# Patient Record
Sex: Female | Born: 1985 | Hispanic: Yes | Marital: Married | Smoking: Never smoker
Health system: Southern US, Community
[De-identification: ages and names within clinical notes are randomized; demographics above are authoritative.]

## PROBLEM LIST (undated history)

## (undated) DIAGNOSIS — Z789 Other specified health status: Secondary | ICD-10-CM

## (undated) DIAGNOSIS — O24419 Gestational diabetes mellitus in pregnancy, unspecified control: Secondary | ICD-10-CM

## (undated) HISTORY — DX: Gestational diabetes mellitus in pregnancy, unspecified control: O24.419

## (undated) HISTORY — PX: NO PAST SURGERIES: SHX2092

## (undated) HISTORY — DX: Other specified health status: Z78.9

---

## 2017-06-25 LAB — OB RESULTS CONSOLE ABO/RH: RH Type: POSITIVE

## 2017-06-25 LAB — SICKLE CELL SCREEN: Sickle Cell Screen: NORMAL

## 2017-06-25 LAB — OB RESULTS CONSOLE RPR: RPR: NONREACTIVE

## 2017-06-25 LAB — OB RESULTS CONSOLE RUBELLA ANTIBODY, IGM: RUBELLA: IMMUNE

## 2017-06-25 LAB — OB RESULTS CONSOLE VARICELLA ZOSTER ANTIBODY, IGG: VARICELLA IGG: IMMUNE

## 2017-06-25 LAB — OB RESULTS CONSOLE HIV ANTIBODY (ROUTINE TESTING): HIV: NONREACTIVE

## 2017-06-25 LAB — OB RESULTS CONSOLE GC/CHLAMYDIA
Chlamydia: NEGATIVE
Gonorrhea: NEGATIVE

## 2017-06-25 LAB — OB RESULTS CONSOLE HEPATITIS B SURFACE ANTIGEN: Hepatitis B Surface Ag: NEGATIVE

## 2017-06-25 LAB — OB RESULTS CONSOLE HGB/HCT, BLOOD: HEMOGLOBIN: 12.6

## 2017-06-28 LAB — OB RESULTS CONSOLE HGB/HCT, BLOOD: HCT: 37

## 2017-06-28 LAB — OB RESULTS CONSOLE PLATELET COUNT: Platelets: 289

## 2017-07-19 ENCOUNTER — Encounter: Payer: Self-pay | Admitting: *Deleted

## 2017-07-19 ENCOUNTER — Ambulatory Visit: Payer: Self-pay | Admitting: *Deleted

## 2017-07-19 ENCOUNTER — Encounter: Payer: Self-pay | Attending: Obstetrics and Gynecology | Admitting: *Deleted

## 2017-07-19 DIAGNOSIS — R7302 Impaired glucose tolerance (oral): Secondary | ICD-10-CM | POA: Insufficient documentation

## 2017-07-19 DIAGNOSIS — R7309 Other abnormal glucose: Secondary | ICD-10-CM

## 2017-07-19 DIAGNOSIS — Z713 Dietary counseling and surveillance: Secondary | ICD-10-CM | POA: Insufficient documentation

## 2017-07-19 NOTE — Progress Notes (Signed)
  Patient was seen on 07/19/2017 for Gestational Diabetes self-management . She is here with Spanish interpretor. She states no history of GDM herself or any known family history of DM. EDD Dec 23, 2017. Diet history indicates good variety of all food groups and 3 meals with 2-3 snacks per day. 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 No other exercise reported. She states she [facility] some [facility]. The following learning objectives were met by the patient :   States the definition of Gestational Diabetes  States why dietary management is important in controlling blood glucose  Describes the effects of carbohydrates on blood glucose levels  Demonstrates ability to create a balanced meal plan  Demonstrates carbohydrate counting   States when to check blood glucose levels  Demonstrates proper blood glucose monitoring techniques  States the effect of stress and exercise on blood glucose levels  States the importance of limiting caffeine and abstaining from alcohol and smoking  Plan:  Aim for 3 Carb Choices per meal (45 grams) +/- 1 either way  Aim for 1-2 Carbs per snack Begin reading food labels for Total Carbohydrate of foods Consider  increasing your activity level by walking or other activity daily as tolerated Begin checking BG before breakfast and 2 hours after first bite of breakfast, lunch and dinner as directed by MD  Bring Log Book to every medical appointment   Take medication if directed by MD  Blood glucose monitor given: True Track Lot # [id number] Exp: 11/11/2017 Blood glucose reading: 82 mg/dl fasting  Patient instructed to monitor glucose levels: FBS: 60 - 95 mg/dl 2 hour: <120 mg/dl  Patient received the following handouts: in Spanish  Nutrition Diabetes and Pregnancy  Carbohydrate Counting List  Patient will be seen for follow-up as  needed.

## 2017-07-31 NOTE — L&D Delivery Note (Signed)
Kathleen Mcgee is a 32 y.o. female G3P2002 with IUP at [redacted]w[redacted]d admitted for spontaneous onset of labor.  She progressed without augmentation to complete and pushed 10 minutes to deliver. The delivery was complicated by a shoulder dystocia.    Delivery Note At 4:17 PM a viable female was delivered via Vaginal, Spontaneous (Presentation: LOA).  APGAR: 1, 9; weight 9 lb 1.3 oz (4119 g).   Placenta status: spontaneous, intact.  Cord: 3 vessels  At delivery of the head, no movement with subsequent push noted. Shoulder dystocia identified.  First Maneuver: McRoberts Second Maneuver: Suprapubic pressure  Dr. Jolayne Panther called to bedside after second maneuver. Code APGAR called.  Third Maneuver: left side lying, attempted woodscrew and rubin maneuvers with no movement. Fourth Maneuver: Antonieta Pert, attempted delivery of posterior arm Fifth Maneuver: right side lying and delivery of posterior arm  Infant delivered rapidly after delivery of posterior arm. Cord clamped and cut by CNM and passed to waiting NICU team. See NICU note.  Total time: 2 minutes 30 seconds  Anesthesia:  none Episiotomy: None Lacerations:  none Suture Repair: n/a Est. Blood Loss (mL):  100  Mom to postpartum.  Baby to Couplet care / Skin to Skin.  Rolm Bookbinder CNM 12/28/2017, 4:36 PM

## 2017-08-01 ENCOUNTER — Encounter: Payer: Self-pay | Admitting: Obstetrics and Gynecology

## 2017-08-06 ENCOUNTER — Encounter: Payer: Self-pay | Admitting: Family Medicine

## 2017-08-30 ENCOUNTER — Other Ambulatory Visit: Payer: Self-pay

## 2017-08-30 ENCOUNTER — Encounter: Payer: Self-pay | Admitting: *Deleted

## 2017-08-30 ENCOUNTER — Encounter: Payer: Self-pay | Admitting: Family Medicine

## 2017-08-30 ENCOUNTER — Ambulatory Visit (INDEPENDENT_AMBULATORY_CARE_PROVIDER_SITE_OTHER): Payer: Self-pay | Admitting: Family Medicine

## 2017-08-30 DIAGNOSIS — O24410 Gestational diabetes mellitus in pregnancy, diet controlled: Secondary | ICD-10-CM | POA: Insufficient documentation

## 2017-08-30 DIAGNOSIS — O0992 Supervision of high risk pregnancy, unspecified, second trimester: Secondary | ICD-10-CM

## 2017-08-30 DIAGNOSIS — O0990 Supervision of high risk pregnancy, unspecified, unspecified trimester: Secondary | ICD-10-CM

## 2017-08-30 DIAGNOSIS — O0993 Supervision of high risk pregnancy, unspecified, third trimester: Secondary | ICD-10-CM | POA: Insufficient documentation

## 2017-08-30 DIAGNOSIS — O24419 Gestational diabetes mellitus in pregnancy, unspecified control: Secondary | ICD-10-CM | POA: Insufficient documentation

## 2017-08-30 LAB — GLUCOSE, 3 HOUR GESTATIONAL

## 2017-08-30 LAB — GLUCOSE, 1 HOUR: Glucose Tolerance, 1 hour: 160

## 2017-08-30 MED ORDER — ASPIRIN 81 MG PO CHEW: 81.0000 mg | CHEWABLE_TABLET | Freq: Every day | ORAL | 3 refills | Status: DC

## 2017-08-30 NOTE — Progress Notes (Signed)
   PRENATAL VISIT NOTE Spanish interpreter: Okey Regalarol used  Subjective:  Kathleen Mcgee is a 32 y.o. G3P2002 at 10335w4d being seen today for transferring prenatal care.  She is currently monitored for the following issues for this high-risk pregnancy and has Supervision of high risk pregnancy, antepartum and Gestational diabetes on their problem list.  Patient reports no complaints.   . Vag. Bleeding: None.  Movement: Present. Denies leaking of fluid.   The following portions of the patient's history were reviewed and updated as appropriate: allergies, current medications, past family history, past medical history, past social history, past surgical history and problem list. Problem list updated.  Objective:   Vitals:   08/30/17 0834  BP: 110/60  Pulse: 81  Weight: 156 lb 4.8 oz (70.9 kg)    Fetal Status: Fetal Heart Rate (bpm): 157 Fundal Height: 23 cm Movement: Present     General:  Alert, oriented and cooperative. Patient is in no acute distress.  Skin: Skin is warm and dry. No rash noted.   Cardiovascular: Normal heart rate noted  Respiratory: Normal respiratory effort, no problems with respiration noted  Abdomen: Soft, gravid, appropriate for gestational age.  Pain/Pressure: Present     Pelvic: Cervical exam deferred        Extremities: Normal range of motion.  Edema: None  Mental Status:  Normal mood and affect. Normal behavior. Normal judgment and thought content.  No book today reports FBS in high 80s and pp in 90-100 range Assessment and Plan:  Pregnancy: G3P2002 at 4735w4d  1. Supervision of high risk pregnancy, antepartum Needs anatomy--+ dating - US MFM OB DETAIL +14 WK; Future  2. Diet controlled gestational diabetes mellitus (GDM) in second trimester Basleine labs and ASA Consider ECHO if A1C is > 6.5  - Comprehensive metabolic panel - Hemoglobin A1c - Protein / creatinine ratio, urine - TSH - aspirin 81 MG chewable tablet; Chew 1 tablet (81 mg total)  by mouth daily.  Dispense: 90 tablet; Refill: 3 - US MFM OB DETAIL +14 WK; Future  Preterm labor symptoms and general obstetric precautions including but not limited to vaginal bleeding, contractions, leaking of fluid and fetal movement were reviewed in detail with the patient. Please refer to After Visit Summary for other counseling recommendations.  Return in about 2 weeks (around 09/13/2017).   Reva Boresanya S Dalyah Pla, MD

## 2017-08-30 NOTE — Patient Instructions (Signed)
 Lactancia materna Breastfeeding Decidir amamantar es una de las mejores elecciones que puede hacer por usted y su beb. Un cambio en las hormonas durante el embarazo hace que las mamas produzcan leche materna en las glndulas productoras de leche. Las hormonas impiden que la leche materna sea liberada antes del nacimiento del beb. Adems, impulsan el flujo de leche luego del nacimiento. Una vez que ha comenzado a amamantar, pensar en el beb, as como la succin o el llanto, pueden estimular la liberacin de leche de las glndulas productoras de leche. Los beneficios de amamantar Las investigaciones demuestran que la lactancia materna ofrece muchos beneficios de salud para bebs y madres. Adems, ofrece una forma gratuita y conveniente de alimentar al beb. Para el beb  La primera leche (calostro) ayuda a mejorar el funcionamiento del aparato digestivo del beb.  Las clulas especiales de la leche (anticuerpos) ayudan a combatir las infecciones en el beb.  Los bebs que se alimentan con leche materna tambin tienen menos probabilidades de tener asma, alergias, obesidad o diabetes de tipo 2. Adems, tienen menor riesgo de sufrir el sndrome de muerte sbita del lactante (SMSL).  Los nutrientes de la leche materna son mejores para satisfacer las necesidades del beb en comparacin con la leche maternizada.  La leche materna mejora el desarrollo cerebral del beb. Para usted  La lactancia materna favorece el desarrollo de un vnculo muy especial entre la madre y el beb.  Es conveniente. La leche materna es econmica y siempre est disponible a la temperatura correcta.  La lactancia materna ayuda a quemar caloras. Le ayuda a perder el peso ganado durante el embarazo.  Hace que el tero vuelva al tamao que tena antes del embarazo ms rpido. Adems, disminuye el sangrado (loquios) despus del parto.  La lactancia materna contribuye a reducir el riesgo de tener diabetes de tipo 2,  osteoporosis, artritis reumatoide, enfermedades cardiovasculares y cncer de mama, ovario, tero y endometrio en el futuro. Informacin bsica sobre la lactancia Comienzo de la lactancia  Encuentre un lugar cmodo para sentarse o acostarse, con un buen respaldo para el cuello y la espalda.  Coloque una almohada o una manta enrollada debajo del beb para acomodarlo a la altura de la mama (si est sentada). Las almohadas para amamantar se han diseado especialmente a fin de servir de apoyo para los brazos y el beb mientras amamanta.  Asegrese de que la barriga del beb (abdomen) est frente a la suya.  Masajee suavemente la mama. Con las yemas de los dedos, masajee los bordes exteriores de la mama hacia adentro, en direccin al pezn. Esto estimula el flujo de leche. Si la leche fluye lentamente, es posible que deba continuar con este movimiento durante la lactancia.  Sostenga la mama con 4 dedos por debajo y el pulgar por arriba del pezn (forme la letra "C" con la mano). Asegrese de que los dedos se encuentren lejos del pezn y de la boca del beb.  Empuje suavemente los labios del beb con el pezn o con el dedo.  Cuando la boca del beb se abra lo suficiente, acrquelo rpidamente a la mama e introduzca todo el pezn y la arola, tanto como sea posible, dentro de la boca del beb. La arola es la zona de color que rodea al pezn. ? Debe haber ms arola visible por arriba del labio superior del beb que por debajo del labio inferior. ? Los labios del beb deben estar abiertos y extendidos hacia afuera (evertidos) para asegurar que   el beb se prenda de forma adecuada y cmoda. ? La lengua del beb debe estar entre la enca inferior y la mama.  Asegrese de que la boca del beb est en la posicin correcta alrededor del pezn (prendido). Los labios del beb deben crear un sello sobre la mama y estar doblados hacia afuera (invertidos).  Es comn que el beb succione durante 2 a 3 minutos  para que comience el flujo de leche materna. Cmo debe prenderse Es muy importante que le ensee al beb cmo prenderse adecuadamente a la mama. Si el beb no se prende adecuadamente, puede causar dolor en los pezones, reducir la produccin de leche materna y hacer que el beb tenga un escaso aumento de peso. Adems, si el beb no se prende adecuadamente al pezn, puede tragar aire durante la alimentacin. Esto puede causarle molestias al beb. Hacer eructar al beb al cambiar de mama puede ayudarlo a liberar el aire. Sin embargo, ensearle al beb cmo prenderse a la mama adecuadamente es la mejor manera de evitar que se sienta molesto por tragar aire mientras se alimenta. Signos de que el beb se ha prendido adecuadamente al pezn  Tironea o succiona de modo silencioso, sin causarle dolor. Los labios del beb deben estar extendidos hacia afuera (evertidos).  Se escucha que traga cada 3 o 4 succiones una vez que la leche ha comenzado a fluir (despus de que se produzca el reflejo de eyeccin de la leche).  Hay movimientos musculares por arriba y por delante de sus odos al succionar.  Signos de que el beb no se ha prendido adecuadamente al pezn  Hace ruidos de succin o de chasquido mientras se alimenta.  Siente dolor en los pezones.  Si cree que el beb no se prendi correctamente, deslice el dedo en la comisura de la boca y colquelo entre las encas del beb para interrumpir la succin. Intente volver a comenzar a amamantar. Signos de lactancia materna exitosa Signos del beb  El beb disminuir gradualmente el nmero de succiones o dejar de succionar por completo.  El beb se quedar dormido.  El cuerpo del beb se relajar.  El beb retendr una pequea cantidad de leche en la boca.  El beb se desprender solo del pecho.  Signos que presenta usted  Las mamas han aumentado la firmeza, el peso y el tamao 1 a 3 horas despus de amamantar.  Estn ms blandas inmediatamente  despus de amamantar.  Se producen un aumento del volumen de leche y un cambio en su consistencia y color hacia el quinto da de lactancia.  Los pezones no duelen, no estn agrietados ni sangran.  Signos de que su beb recibe la cantidad de leche suficiente  Mojar por lo menos 1 o 2paales durante las primeras 24horas despus del nacimiento.  Mojar por lo menos 5 o 6paales cada 24horas durante la primera semana despus del nacimiento. La orina debe ser clara o de color amarillo plido a los 5das de vida.  Mojar entre 6 y 8paales cada 24horas a medida que el beb sigue creciendo y desarrollndose.  Defeca por lo menos 3 veces en 24 horas a los 5 das de vida. Las heces deben ser blandas y amarillentas.  Defeca por lo menos 3 veces en 24 horas a los 7 das de vida. Las heces deben ser grumosas y amarillentas.  No registra una prdida de peso mayor al 10% del peso al nacer durante los primeros 3 das de vida.  Aumenta de peso un   promedio de 4 a 7onzas (113 a 198g) por semana despus de los 4 das de vida.  Aumenta de peso, diariamente, de manera uniforme a partir de los 5 das de vida, sin registrar prdida de peso despus de las 2semanas de vida. Despus de alimentarse, es posible que el beb regurgite una pequea cantidad de leche. Esto es normal. Frecuencia y duracin de la lactancia El amamantamiento frecuente la ayudar a producir ms leche y puede prevenir dolores en los pezones y las mamas extremadamente llenas (congestin mamaria). Alimente al beb cuando muestre signos de hambre o si siente la necesidad de reducir la congestin de las mamas. Esto se denomina "lactancia a demanda". Las seales de que el beb tiene hambre incluyen las siguientes:  Aumento del estado de alerta, actividad o inquietud.  Mueve la cabeza de un lado a otro.  Abre la boca cuando se le toca la mejilla o la comisura de la boca (reflejo de bsqueda).  Aumenta las vocalizaciones, tales como  sonidos de succin, se relame los labios, emite arrullos, suspiros o chirridos.  Mueve la mano hacia la boca y se chupa los dedos o las manos.  Est molesto o llora.  Evite el uso del chupete en las primeras 4 a 6 semanas despus del nacimiento del beb. Despus de este perodo, podr usar un chupete. Las investigaciones demostraron que el uso del chupete durante el primer ao de vida del beb disminuye el riesgo de tener el sndrome de muerte sbita del lactante (SMSL). Permita que el nio se alimente en cada mama todo lo que desee. Cuando el beb se desprende o se queda dormido mientras se est alimentando de la primera mama, ofrzcale la segunda. Debido a que, con frecuencia, los recin nacidos estn somnolientos las primeras semanas de vida, es posible que deba despertar al beb para alimentarlo. Los horarios de lactancia varan de un beb a otro. Sin embargo, las siguientes reglas pueden servir como gua para ayudarla a garantizar que el beb se alimenta adecuadamente:  Se puede amamantar a los recin nacidos (bebs de 4 semanas o menos de vida) cada 1 a 3 horas.  No deben transcurrir ms de 3 horas durante el da o 5 horas durante la noche sin que se amamante a los recin nacidos.  Debe amamantar al beb un mnimo de 8 veces en un perodo de 24 horas.  Extraccin de leche materna La extraccin y el almacenamiento de la leche materna le permiten asegurarse de que el beb se alimente exclusivamente de su leche materna, aun en momentos en los que no puede amamantar. Esto tiene especial importancia si debe regresar al trabajo en el perodo en que an est amamantando o si no puede estar presente en los momentos en que el beb debe alimentarse. Su asesor en lactancia puede ayudarla a encontrar un mtodo de extraccin que funcione mejor para usted y orientarla sobre cunto tiempo es seguro almacenar leche materna. Cmo cuidar las mamas durante la lactancia Los pezones pueden secarse, agrietarse y  doler durante la lactancia. Las siguientes recomendaciones pueden ayudarla a mantener las mamas humectadas y sanas:  Evite usar jabn en los pezones.  Use un sostn de soporte diseado especialmente para la lactancia materna. Evite usar sostenes con aro o sostenes muy ajustados (sostenes deportivos).  Seque al aire sus pezones durante 3 a 4minutos despus de amamantar al beb.  Utilice solo apsitos de algodn en el sostn para absorber las prdidas de leche. La prdida de un poco de leche materna   entre las tomas es normal.  Utilice lanolina sobre los pezones luego de amamantar. La lanolina ayuda a mantener la humedad normal de la piel. La lanolina pura no es perjudicial (no es txica) para el beb. Adems, puede extraer manualmente algunas gotas de leche materna y masajear suavemente esa leche sobre los pezones para que la leche se seque al aire.  Durante las primeras semanas despus del nacimiento, algunas mujeres experimentan congestin mamaria. La congestin mamaria puede hacer que sienta las mamas pesadas, calientes y sensibles al tacto. El pico de la congestin mamaria ocurre en el plazo de los 3 a 5 das despus del parto. Las siguientes recomendaciones pueden ayudarla a aliviar la congestin mamaria:  Vace por completo las mamas al amamantar o extraer leche. Puede aplicar calor hmedo en las mamas (en la ducha o con toallas hmedas para manos) antes de amamantar o extraer leche. Esto aumenta la circulacin y ayuda a que la leche fluya. Si el beb no vaca por completo las mamas cuando lo amamanta, extraiga la leche restante despus de que haya finalizado.  Aplique compresas de hielo sobre las mamas inmediatamente despus de amamantar o extraer leche, a menos que le resulte demasiado incmodo. Haga lo siguiente: ? Ponga el hielo en una bolsa plstica. ? Coloque una toalla entre la piel y la bolsa de hielo. ? Coloque el hielo durante 20minutos, 2 o 3veces por da.  Asegrese de que el  beb est prendido y se encuentre en la posicin correcta mientras lo alimenta.  Si la congestin mamaria persiste luego de 48 horas o despus de seguir estas recomendaciones, comunquese con su mdico o un asesor en lactancia. Recomendaciones de salud general durante la lactancia  Consuma 3 comidas y 3 colaciones saludables todos los das. Las madres bien alimentadas que amamantan necesitan entre 450 y 500 caloras adicionales por da. Puede cumplir con este requisito al aumentar la cantidad de una dieta equilibrada que realice.  Beba suficiente agua para mantener la orina clara o de color amarillo plido.  Descanse con frecuencia, reljese y siga tomando sus vitaminas prenatales para prevenir la fatiga, el estrs y los niveles bajos de vitaminas y minerales en el cuerpo (deficiencias de nutrientes).  No consuma ningn producto que contenga nicotina o tabaco, como cigarrillos y cigarrillos electrnicos. El beb puede verse afectado por las sustancias qumicas de los cigarrillos que pasan a la leche materna y por la exposicin al humo ambiental del tabaco. Si necesita ayuda para dejar de fumar, consulte al mdico.  Evite el consumo de alcohol.  No consuma drogas ilegales o marihuana.  Antes de usar cualquier medicamento, hable con el mdico. Estos incluyen medicamentos recetados y de venta libre, como tambin vitaminas y suplementos a base de hierbas. Algunos medicamentos, que pueden ser perjudiciales para el beb, pueden pasar a travs de la leche materna.  Puede quedar embarazada durante la lactancia. Si se desea un mtodo anticonceptivo, consulte al mdico sobre cules son las opciones seguras durante la lactancia. Dnde encontrar ms informacin: Liga internacional [place]eche: www.llli.org. Comunquese con un mdico si:  Siente que quiere dejar de amamantar o se siente frustrada con la lactancia.  Sus pezones estn agrietados o sangran.  Sus mamas estn irritadas, sensibles o  calientes.  Tiene los siguientes sntomas: ? Dolor en las mamas o en los pezones. ? Un rea hinchada en cualquiera de las mamas. ? Fiebre o escalofros. ? Nuseas o vmitos. ? Drenaje de otro lquido distinto de la leche materna desde los pezones.    mamas no se llenan antes de amamantar al beb para el quinto da despus del [place].  Se siente triste y deprimida.  El beb: ? Est demasiado somnoliento como para comer bien. ? Tiene problemas para dormir. ? Tiene ms de 1 semana de vida y [employer] de 6 paales en un periodo de 24 horas. ? No ha aumentado de [place] a los [address] de [address]  El beb defeca menos de 3 veces en 24 horas.  La piel del beb o las partes blancas de los ojos se vuelven amarillentas. Solicite ayuda de inmediato si:  El beb est muy cansado Retail buyer) y no se quiere despertar para comer.  Le sube la fiebre sin causa. Resumen  La lactancia materna ofrece muchos beneficios de salud para bebs y [place].  Intente amamantar a su beb cuando muestre signos tempranos de hambre.  Haga cosquillas o empuje suavemente los labios del beb con el dedo o el pezn para lograr que el beb abra la boca. Acerque el beb a la mama. Asegrese de que la mayor parte de la arola se encuentre dentro de la boca del beb. Ofrzcale una mama y haga eructar al beb antes de pasar a la otra.  Hable con su mdico o asesor en lactancia si tiene dudas o problemas con la lactancia. Esta informacin no tiene Theme park manager el consejo del mdico. Asegrese de hacerle al mdico cualquier pregunta que tenga. Document Released: 07/17/2005 Document Revised: 11/06/2016 Document Reviewed: 11/06/2016 Elsevier Interactive Patient Education  2018 [employer]. Diagnstico de diabetes mellitus gestacional (Gestational Diabetes Mellitus, Diagnosis) La diabetes gestacional (diabetes mellitus gestacional) es una forma de diabetes a corto plazo (temporal) que puede Museum/gallery curator.  Este cuadro desaparece despus del parto. Puede deberse a uno de [employer] o a ambos:  El cuerpo no produce la cantidad suficiente de una hormona llamada insulina.  El cuerpo no responde de forma normal a la insulina que produce. La insulina permite que los ciertos azcares (glucosa) ingresen a las clulas del cuerpo. Esto le proporciona la energa. Cuando se tiene diabetes, la glucosa no puede ingresar a las clulas. Esto produce un aumento del nivel de glucosa en la sangre (hiperglucemia). Si la diabetes se trata, es posible que ni usted ni el beb se vean afectados. El mdico fijar los objetivos del tratamiento para usted. Generalmente, los resultados de la glucemia deben ser los siguientes:  Despus de no comer durante mucho tiempo (ayunar): 95mg /dl (3,2GMWN/U).  Despus de las comidas (posprandial): ? Una hora despus de una comida: igual o menor que 140mg /dl (2,7OZDG/U). ? Dos horas despus de una comida: igual o menor que 120mg /dl (4,4IHKV/Q).  Nivel de A1c (hemoglobinaA1c): del 6% al 6,5%. CUIDADOS EN EL HOGAR Preguntas para hacerle al mdico Puede hacer las siguientes preguntas:  Debo reunirme con IT trainer para el cuidado de la diabetes?  Dnde puedo encontrar un grupo de apoyo para personas diabticas?  Qu equipos necesitar para cuidarme en casa?  Qu medicamentos para la diabetes necesito? Cundo debo tomarlos?  Con qu frecuencia debo controlarme el nivel de glucosa en la sangre?  A qu nmero puedo llamar si tengo preguntas?  Cundo es la prxima cita con el mdico? Instrucciones generales  [employer] medicamentos de venta libre y los recetados solamente como se lo haya indicado el mdico.  Mantenga un peso saludable durante el [place].  Concurra a todas las visitas de control como se lo haya indicado el mdico. Esto es importante. SOLICITE AYUDA  SI:  Su nivel de glucosa en la sangre es igual o superior a 240mg /dl  (16,1WRUE/AV13,3mmol/dl).  Su nivel de glucosa en la sangre es igual o superior a 200mg /dl (40,9WJXB/J11,1mmol/l), y tiene cetonas en la orina.  Ha estado enferma o ha tenido fiebre durante 2o ms [address]  [address] y no [place].  Si tiene alguno de estos problemas durante ms de 6horas: ? No puede comer ni beber. ? Siente malestar estomacal (nuseas). ? Vomita. ? La materia fecal es lquida (diarrea). SOLICITE AYUDA DE INMEDIATO SI:  El nivel de glucosa en la sangre est por debajo de 54mg /dl (693mmol/l).  Est confundida.  Tiene dificultad para hacer lo siguiente: ? Pensar con claridad. ? Respirar.  El beb se mueve menos de lo normal.  Tiene los siguientes sntomas: ? Niveles moderados o altos de cetonas en la orina. ? Hemorragia vaginal. ? Secrecin de un lquido fuera de lo comn de la vagina. ? Contracciones prematuras. Estas pueden causar una sensacin de opresin en el vientre. Esta informacin no tiene Theme park managercomo fin reemplazar el consejo del mdico. Asegrese de hacerle al mdico cualquier pregunta que tenga. Document Released: 11/08/2015 Document Revised: 11/08/2015 Document Reviewed: 08/20/2015 Elsevier Interactive Patient Education  [employer]2018 Elsevier Inc.

## 2017-08-31 LAB — COMPREHENSIVE METABOLIC PANEL
ALBUMIN: 3.2 g/dL — AB (ref 3.5–5.5)
ALK PHOS: 88 IU/L (ref 39–117)
ALT: 8 IU/L (ref 0–32)
AST: 8 IU/L (ref 0–40)
Albumin/Globulin Ratio: 1.1 — ABNORMAL LOW (ref 1.2–2.2)
BUN / CREAT RATIO: 12 (ref 9–23)
BUN: 6 mg/dL (ref 6–20)
Bilirubin Total: <0.2 mg/dL (ref 0.0–1.2)
CALCIUM: 9.2 mg/dL (ref 8.7–10.2)
CO2: 20 mmol/L (ref 20–29)
CREATININE: 0.51 mg/dL — AB (ref 0.57–1.00)
Chloride: 104 mmol/L (ref 96–106)
GFR, EST AFRICAN AMERICAN: 146 mL/min/{1.73_m2} (ref >59)
GFR, EST NON AFRICAN AMERICAN: 127 mL/min/{1.73_m2} (ref >59)
GLOBULIN, TOTAL: 3 g/dL (ref 1.5–4.5)
GLUCOSE: 159 mg/dL — AB (ref 65–99)
Potassium: 4 mmol/L (ref 3.5–5.2)
SODIUM: 140 mmol/L (ref 134–144)
TOTAL PROTEIN: 6.2 g/dL (ref 6.0–8.5)

## 2017-08-31 LAB — HEMOGLOBIN A1C
Est. average glucose Bld gHb Est-mCnc: 134 mg/dL
HEMOGLOBIN A1C: 6.3 % — AB (ref 4.8–5.6)

## 2017-08-31 LAB — PROTEIN / CREATININE RATIO, URINE
Creatinine, Urine: 74.7 mg/dL
PROTEIN UR: 9.6 mg/dL
PROTEIN/CREAT RATIO: 129 mg/g{creat} (ref 0–200)

## 2017-08-31 LAB — TSH: TSH: 2.57 u[IU]/mL (ref 0.450–4.500)

## 2017-09-04 ENCOUNTER — Encounter (HOSPITAL_COMMUNITY): Payer: Self-pay

## 2017-09-04 ENCOUNTER — Ambulatory Visit (HOSPITAL_COMMUNITY)
Admission: RE | Admit: 2017-09-04 | Discharge: 2017-09-04 | Disposition: A | Discharge status: Home / Self Care | Payer: Self-pay | Source: Ambulatory Visit | Attending: Family Medicine | Admitting: Family Medicine

## 2017-09-04 ENCOUNTER — Other Ambulatory Visit: Payer: Self-pay | Admitting: Family Medicine

## 2017-09-04 DIAGNOSIS — O0990 Supervision of high risk pregnancy, unspecified, unspecified trimester: Secondary | ICD-10-CM

## 2017-09-04 DIAGNOSIS — Z3A24 24 weeks gestation of pregnancy: Secondary | ICD-10-CM

## 2017-09-04 DIAGNOSIS — Z363 Encounter for antenatal screening for malformations: Secondary | ICD-10-CM | POA: Insufficient documentation

## 2017-09-04 DIAGNOSIS — Z3689 Encounter for other specified antenatal screening: Secondary | ICD-10-CM | POA: Insufficient documentation

## 2017-09-04 DIAGNOSIS — O24410 Gestational diabetes mellitus in pregnancy, diet controlled: Secondary | ICD-10-CM | POA: Insufficient documentation

## 2017-09-04 DIAGNOSIS — O09892 Supervision of other high risk pregnancies, second trimester: Secondary | ICD-10-CM | POA: Insufficient documentation

## 2017-09-13 ENCOUNTER — Encounter: Payer: Self-pay | Admitting: Obstetrics and Gynecology

## 2017-12-03 ENCOUNTER — Telehealth: Payer: Self-pay | Admitting: *Deleted

## 2017-12-03 ENCOUNTER — Ambulatory Visit (INDEPENDENT_AMBULATORY_CARE_PROVIDER_SITE_OTHER): Payer: Self-pay | Admitting: Obstetrics & Gynecology

## 2017-12-03 VITALS — BP 121/84 | HR 88 | Wt 166.7 lb

## 2017-12-03 DIAGNOSIS — Z113 Encounter for screening for infections with a predominantly sexual mode of transmission: Secondary | ICD-10-CM

## 2017-12-03 DIAGNOSIS — O0993 Supervision of high risk pregnancy, unspecified, third trimester: Secondary | ICD-10-CM

## 2017-12-03 DIAGNOSIS — O0990 Supervision of high risk pregnancy, unspecified, unspecified trimester: Secondary | ICD-10-CM

## 2017-12-03 DIAGNOSIS — O24410 Gestational diabetes mellitus in pregnancy, diet controlled: Secondary | ICD-10-CM

## 2017-12-03 LAB — POCT URINALYSIS DIP (DEVICE)
Bilirubin Urine: NEGATIVE
GLUCOSE, UA: NEGATIVE mg/dL
Hgb urine dipstick: NEGATIVE
Ketones, ur: NEGATIVE mg/dL
NITRITE: NEGATIVE
Protein, ur: NEGATIVE mg/dL
SPECIFIC GRAVITY, URINE: 1.015 (ref 1.005–1.030)
UROBILINOGEN UA: 0.2 mg/dL (ref 0.0–1.0)
pH: 6.5 (ref 5.0–8.0)

## 2017-12-03 NOTE — Telephone Encounter (Signed)
Patient is scheduled for a f/u u/s 5/17 . Spanish interpreter Cicero Duck used to call patient. Here was no answer. Voice mail was left stating date, time and location of appt. Pt to call back if any questions.

## 2017-12-03 NOTE — Progress Notes (Signed)
   PRENATAL VISIT NOTE  Subjective:  Kathleen Mcgee is a 32 y.o. G3P2002 at [redacted]w[redacted]d being seen today for ongoing prenatal care.  She is currently monitored for the following issues for this high-risk pregnancy and has Supervision of high risk pregnancy, antepartum and Gestational diabetes on their problem list.  Patient reports no complaints.  Contractions: Irritability. Vag. Bleeding: None.  Movement: Present. Denies leaking of fluid.   The following portions of the patient's history were reviewed and updated as appropriate: allergies, current medications, past family history, past medical history, past social history, past surgical history and problem list. Problem list updated.  Objective:   Vitals:   12/03/17 0945  BP: 121/84  Pulse: 88  Weight: 166 lb 11.2 oz (75.6 kg)    Fetal Status:     Movement: Present     General:  Alert, oriented and cooperative. Patient is in no acute distress.  Skin: Skin is warm and dry. No rash noted.   Cardiovascular: Normal heart rate noted  Respiratory: Normal respiratory effort, no problems with respiration noted  Abdomen: Soft, gravid, appropriate for gestational age.  Pain/Pressure: Present     Pelvic: Cervical exam performed        Extremities: Normal range of motion.  Edema: Trace  Mental Status: Normal mood and affect. Normal behavior. Normal judgment and thought content.   Assessment and Plan:  Pregnancy: G3P2002 at 100w1d  1. Supervision of high risk pregnancy in third trimester  - CBC - HIV antibody - RPR - Strep Gp B NAA - Cervicovaginal ancillary only - RPR - HIV antibody - CBC - [place] MFM OB FOLLOW UP; Future - Culture, beta strep (group b only) - Cervicovaginal ancillary only  2. Supervision of high risk pregnancy, antepartum   3. Diet controlled gestational diabetes mellitus (GDM) in third trimester  - Hemoglobin A1c - Stop taking baby asa  Term labor symptoms and general obstetric precautions including  but not limited to vaginal bleeding, contractions, leaking of fluid and fetal movement were reviewed in detail with the patient. Please refer to After Visit Summary for other counseling recommendations.  Return in about 1 week (around 12/10/2017).  No future appointments.  Allie Bossier, MD

## 2017-12-04 LAB — CERVICOVAGINAL ANCILLARY ONLY
BACTERIAL VAGINITIS: NEGATIVE
Candida vaginitis: POSITIVE — AB
Chlamydia: NEGATIVE
Chlamydia: NEGATIVE
NEISSERIA GONORRHEA: NEGATIVE
Neisseria Gonorrhea: NEGATIVE
TRICH (WINDOWPATH): NEGATIVE

## 2017-12-04 LAB — CBC
HEMATOCRIT: 35 % (ref 34.0–46.6)
HEMOGLOBIN: 12.1 g/dL (ref 11.1–15.9)
MCH: 31.6 pg (ref 26.6–33.0)
MCHC: 34.6 g/dL (ref 31.5–35.7)
MCV: 91 fL (ref 79–97)
Platelets: 298 10*3/uL (ref 150–379)
RBC: 3.83 x10E6/uL (ref 3.77–5.28)
RDW: 14.9 % (ref 12.3–15.4)
WBC: 4.8 10*3/uL (ref 3.4–10.8)

## 2017-12-04 LAB — RPR: RPR: NONREACTIVE

## 2017-12-04 LAB — HIV ANTIBODY (ROUTINE TESTING W REFLEX): HIV Screen 4th Generation wRfx: NONREACTIVE

## 2017-12-04 LAB — HEMOGLOBIN A1C
Est. average glucose Bld gHb Est-mCnc: 146 mg/dL
Hgb A1c MFr Bld: 6.7 % — ABNORMAL HIGH (ref 4.8–5.6)

## 2017-12-05 LAB — STREP GP B NAA: STREP GROUP B AG: NEGATIVE

## 2017-12-11 ENCOUNTER — Ambulatory Visit (INDEPENDENT_AMBULATORY_CARE_PROVIDER_SITE_OTHER): Payer: Self-pay | Admitting: Obstetrics and Gynecology

## 2017-12-11 VITALS — BP 122/82 | HR 84 | Wt 166.8 lb

## 2017-12-11 DIAGNOSIS — O0990 Supervision of high risk pregnancy, unspecified, unspecified trimester: Secondary | ICD-10-CM

## 2017-12-11 DIAGNOSIS — O24410 Gestational diabetes mellitus in pregnancy, diet controlled: Secondary | ICD-10-CM

## 2017-12-11 LAB — POCT URINALYSIS DIP (DEVICE)
Bilirubin Urine: NEGATIVE
Glucose, UA: NEGATIVE mg/dL
KETONES UR: NEGATIVE mg/dL
Nitrite: NEGATIVE
PROTEIN: NEGATIVE mg/dL
SPECIFIC GRAVITY, URINE: 1.01 (ref 1.005–1.030)
UROBILINOGEN UA: 0.2 mg/dL (ref 0.0–1.0)
pH: 6.5 (ref 5.0–8.0)

## 2017-12-11 NOTE — Patient Instructions (Signed)
Check sugars daily

## 2017-12-11 NOTE — Progress Notes (Signed)
Subjective:  Kathleen Mcgee is a 32 y.o. G3P2002 at [redacted]w[redacted]d being seen today for ongoing prenatal care.  She is currently monitored for the following issues for this high-risk pregnancy and has Supervision of high risk pregnancy, antepartum and Gestational diabetes on their problem list.  Patient reports no complaints.  Contractions: Irregular. Vag. Bleeding: None.  Movement: Present. Denies leaking of fluid.   The following portions of the patient's history were reviewed and updated as appropriate: allergies, current medications, past family history, past medical history, past social history, past surgical history and problem list. Problem list updated.  Objective:   Vitals:   12/11/17 1115  BP: 122/82  Pulse: 84  Weight: 166 lb 12.8 oz (75.7 kg)    Fetal Status: Fetal Heart Rate (bpm): 152 Fundal Height: 38 cm Movement: Present  Presentation: Vertex  General:  Alert, oriented and cooperative. Patient is in no acute distress.  Skin: Skin is warm and dry. No rash noted.   Cardiovascular: Normal heart rate noted  Respiratory: Normal respiratory effort, no problems with respiration noted  Abdomen: Soft, gravid, appropriate for gestational age. Pain/Pressure: Present     Pelvic: Vag. Bleeding: None     Cervical exam performed Dilation: 3.5 Effacement (%): 50 Station: -3  Extremities: Normal range of motion.  Edema: Trace  Mental Status: Normal mood and affect. Normal behavior. Normal judgment and thought content.   Urinalysis:      Assessment and Plan:  Pregnancy: G3P2002 at [redacted]w[redacted]d  1. Supervision of high risk pregnancy, antepartum Doing well. Schedule induction at next visit.   2. Diet controlled gestational diabetes mellitus (GDM) in third trimester No CBG logs today. States sugars run in the 80s fasting and PP 90-100s. A1c last week 6.7. Not on medications. Growth [place] scheduled for Friday.   Term labor symptoms and general obstetric precautions including but not  limited to vaginal bleeding, contractions, leaking of fluid and fetal movement were reviewed in detail with the patient. Please refer to After Visit Summary for other counseling recommendations.  Return in about 1 week (around 12/18/2017) for ob visit.   Pincus Large, DO

## 2017-12-14 ENCOUNTER — Encounter (HOSPITAL_COMMUNITY): Payer: Self-pay

## 2017-12-14 ENCOUNTER — Ambulatory Visit (HOSPITAL_COMMUNITY)
Admission: RE | Admit: 2017-12-14 | Discharge: 2017-12-14 | Disposition: A | Discharge status: Home / Self Care | Payer: Self-pay | Source: Ambulatory Visit | Attending: Obstetrics & Gynecology | Admitting: Obstetrics & Gynecology

## 2017-12-14 ENCOUNTER — Other Ambulatory Visit: Payer: Self-pay | Admitting: Obstetrics & Gynecology

## 2017-12-14 DIAGNOSIS — O24410 Gestational diabetes mellitus in pregnancy, diet controlled: Secondary | ICD-10-CM

## 2017-12-14 DIAGNOSIS — Z362 Encounter for other antenatal screening follow-up: Secondary | ICD-10-CM

## 2017-12-14 DIAGNOSIS — O09529 Supervision of elderly multigravida, unspecified trimester: Secondary | ICD-10-CM | POA: Insufficient documentation

## 2017-12-14 DIAGNOSIS — O09523 Supervision of elderly multigravida, third trimester: Secondary | ICD-10-CM

## 2017-12-14 DIAGNOSIS — Z3A38 38 weeks gestation of pregnancy: Secondary | ICD-10-CM

## 2017-12-14 DIAGNOSIS — O0993 Supervision of high risk pregnancy, unspecified, third trimester: Secondary | ICD-10-CM | POA: Insufficient documentation

## 2017-12-18 ENCOUNTER — Telehealth: Payer: Self-pay | Admitting: Obstetrics and Gynecology

## 2017-12-18 ENCOUNTER — Encounter: Payer: Self-pay | Admitting: Obstetrics and Gynecology

## 2017-12-18 NOTE — Telephone Encounter (Signed)
Spanish Interpreter [employer]) left a message to have the patient call our office back to rsch her appt.

## 2017-12-28 ENCOUNTER — Encounter (HOSPITAL_COMMUNITY): Payer: Self-pay

## 2017-12-28 ENCOUNTER — Other Ambulatory Visit: Payer: Self-pay

## 2017-12-28 ENCOUNTER — Inpatient Hospital Stay (HOSPITAL_COMMUNITY)
Admission: AD | Admit: 2017-12-28 | Discharge: 2017-12-30 | DRG: 807 | Disposition: A | Discharge status: Home / Self Care | Payer: Medicaid Other | Attending: Obstetrics and Gynecology | Admitting: Obstetrics and Gynecology

## 2017-12-28 DIAGNOSIS — Z3483 Encounter for supervision of other normal pregnancy, third trimester: Secondary | ICD-10-CM | POA: Diagnosis present

## 2017-12-28 DIAGNOSIS — Z3A40 40 weeks gestation of pregnancy: Secondary | ICD-10-CM

## 2017-12-28 DIAGNOSIS — O24420 Gestational diabetes mellitus in childbirth, diet controlled: Principal | ICD-10-CM | POA: Diagnosis present

## 2017-12-28 DIAGNOSIS — O480 Post-term pregnancy: Secondary | ICD-10-CM

## 2017-12-28 LAB — TYPE AND SCREEN
ABO/RH(D): A POS
Antibody Screen: NEGATIVE

## 2017-12-28 LAB — CBC
HCT: 36.6 % (ref 36.0–46.0)
HEMOGLOBIN: 12.7 g/dL (ref 12.0–15.0)
MCH: 32.6 pg (ref 26.0–34.0)
MCHC: 34.7 g/dL (ref 30.0–36.0)
MCV: 94.1 fL (ref 78.0–100.0)
Platelets: 189 10*3/uL (ref 150–400)
RBC: 3.89 MIL/uL (ref 3.87–5.11)
RDW: 15.5 % (ref 11.5–15.5)
WBC: 5.5 10*3/uL (ref 4.0–10.5)

## 2017-12-28 LAB — ABO/RH: ABO/RH(D): A POS

## 2017-12-28 LAB — GLUCOSE, CAPILLARY: Glucose-Capillary: 68 mg/dL (ref 65–99)

## 2017-12-28 MED ORDER — OXYCODONE-ACETAMINOPHEN 5-325 MG PO TABS: 2.0000 | ORAL_TABLET | ORAL | Status: DC | PRN

## 2017-12-28 MED ORDER — IBUPROFEN 600 MG PO TABS
600.0000 mg | ORAL_TABLET | Freq: Four times a day (QID) | ORAL | Status: DC
  Administered 2017-12-28 – 2017-12-30 (×7): 600 mg via ORAL
  Filled 2017-12-28 (×7): qty 1

## 2017-12-28 MED ORDER — BENZOCAINE-MENTHOL 20-0.5 % EX AERO: 1.0000 "application " | INHALATION_SPRAY | CUTANEOUS | Status: DC | PRN

## 2017-12-28 MED ORDER — DIBUCAINE 1 % RE OINT: 1.0000 "application " | TOPICAL_OINTMENT | RECTAL | Status: DC | PRN

## 2017-12-28 MED ORDER — OXYCODONE-ACETAMINOPHEN 5-325 MG PO TABS: 1.0000 | ORAL_TABLET | ORAL | Status: DC | PRN

## 2017-12-28 MED ORDER — SENNOSIDES-DOCUSATE SODIUM 8.6-50 MG PO TABS
2.0000 | ORAL_TABLET | ORAL | Status: DC
  Administered 2017-12-28: 2 via ORAL
  Filled 2017-12-28 (×2): qty 2

## 2017-12-28 MED ORDER — LACTATED RINGERS IV SOLN: 500.0000 mL | INTRAVENOUS | Status: DC | PRN

## 2017-12-28 MED ORDER — PRENATAL MULTIVITAMIN CH
1.0000 | ORAL_TABLET | Freq: Every day | ORAL | Status: DC
  Administered 2017-12-29: 1 via ORAL
  Filled 2017-12-28: qty 1

## 2017-12-28 MED ORDER — ONDANSETRON HCL 4 MG PO TABS: 4.0000 mg | ORAL_TABLET | ORAL | Status: DC | PRN

## 2017-12-28 MED ORDER — PHENYLEPHRINE 40 MCG/ML (10ML) SYRINGE FOR IV PUSH (FOR BLOOD PRESSURE SUPPORT)
80.0000 ug | PREFILLED_SYRINGE | INTRAVENOUS | Status: DC | PRN
  Filled 2017-12-28: qty 5

## 2017-12-28 MED ORDER — WITCH HAZEL-GLYCERIN EX PADS: 1.0000 "application " | MEDICATED_PAD | CUTANEOUS | Status: DC | PRN

## 2017-12-28 MED ORDER — EPHEDRINE 5 MG/ML INJ
10.0000 mg | INTRAVENOUS | Status: DC | PRN
  Filled 2017-12-28: qty 2

## 2017-12-28 MED ORDER — ONDANSETRON HCL 4 MG/2ML IJ SOLN: 4.0000 mg | INTRAMUSCULAR | Status: DC | PRN

## 2017-12-28 MED ORDER — ACETAMINOPHEN 325 MG PO TABS: 650.0000 mg | ORAL_TABLET | ORAL | Status: DC | PRN

## 2017-12-28 MED ORDER — ZOLPIDEM TARTRATE 5 MG PO TABS: 5.0000 mg | ORAL_TABLET | Freq: Every evening | ORAL | Status: DC | PRN

## 2017-12-28 MED ORDER — FENTANYL 2.5 MCG/ML BUPIVACAINE 1/10 % EPIDURAL INFUSION (WH - ANES): 14.0000 mL/h | INTRAMUSCULAR | Status: DC | PRN

## 2017-12-28 MED ORDER — SIMETHICONE 80 MG PO CHEW: 80.0000 mg | CHEWABLE_TABLET | ORAL | Status: DC | PRN

## 2017-12-28 MED ORDER — ONDANSETRON HCL 4 MG/2ML IJ SOLN: 4.0000 mg | Freq: Four times a day (QID) | INTRAMUSCULAR | Status: DC | PRN

## 2017-12-28 MED ORDER — DIPHENHYDRAMINE HCL 25 MG PO CAPS: 25.0000 mg | ORAL_CAPSULE | Freq: Four times a day (QID) | ORAL | Status: DC | PRN

## 2017-12-28 MED ORDER — LIDOCAINE HCL (PF) 1 % IJ SOLN
30.0000 mL | INTRAMUSCULAR | Status: DC | PRN
  Administered 2017-12-28: 30 mL via SUBCUTANEOUS
  Filled 2017-12-28: qty 30

## 2017-12-28 MED ORDER — OXYTOCIN 40 UNITS IN LACTATED RINGERS INFUSION - SIMPLE MED
2.5000 [IU]/h | INTRAVENOUS | Status: DC
  Filled 2017-12-28: qty 1000

## 2017-12-28 MED ORDER — TETANUS-DIPHTH-ACELL PERTUSSIS 5-2.5-18.5 LF-MCG/0.5 IM SUSP: 0.5000 mL | Freq: Once | INTRAMUSCULAR | Status: DC

## 2017-12-28 MED ORDER — LACTATED RINGERS IV SOLN
INTRAVENOUS | Status: DC
  Administered 2017-12-28: 13:00:00 via INTRAVENOUS

## 2017-12-28 MED ORDER — OXYTOCIN BOLUS FROM INFUSION
500.0000 mL | Freq: Once | INTRAVENOUS | Status: AC
  Administered 2017-12-28: 500 mL via INTRAVENOUS

## 2017-12-28 MED ORDER — FENTANYL CITRATE (PF) 100 MCG/2ML IJ SOLN: 50.0000 ug | INTRAMUSCULAR | Status: DC | PRN

## 2017-12-28 MED ORDER — SOD CITRATE-CITRIC ACID 500-334 MG/5ML PO SOLN: 30.0000 mL | ORAL | Status: DC | PRN

## 2017-12-28 MED ORDER — COCONUT OIL OIL: 1.0000 "application " | TOPICAL_OIL | Status: DC | PRN

## 2017-12-28 MED ORDER — FLEET ENEMA 7-19 GM/118ML RE ENEM: 1.0000 | ENEMA | RECTAL | Status: DC | PRN

## 2017-12-28 MED ORDER — DIPHENHYDRAMINE HCL 50 MG/ML IJ SOLN: 12.5000 mg | INTRAMUSCULAR | Status: DC | PRN

## 2017-12-28 MED ORDER — LACTATED RINGERS IV SOLN: 500.0000 mL | Freq: Once | INTRAVENOUS | Status: DC

## 2017-12-28 NOTE — Progress Notes (Signed)
Pt had signed interpreter waiver for FOB to interpret during hospital stay. Hospital medical interpreter called during shoulder dystocia to speak to pt and family.

## 2017-12-28 NOTE — Progress Notes (Signed)
LABOR PROGRESS NOTE  Berenice BoutonViridiana Hernandez-Medrano is a 32 y.o. G3P2002 at 4149w5d  admitted for spontaneous onset of labor  Subjective: Patient doing well-- reports a lot of pain and pressure but states that it is manageable. Patient is agreeable to AROM to help with labor progression. No other concerns or changes.  Objective: BP 119/64   Pulse 78   Temp 98.2 F (36.8 C) (Oral)   Resp 18   Wt 169 lb 8 oz (76.9 kg)   LMP 03/18/2017 (Exact Date)   SpO2 99%   BMI 31.00 kg/m  or        Vitals:   12/28/17 1455 12/28/17 1457 12/28/17 1528 12/28/17 1537  BP:  (!) 111/58  119/64  Pulse:  77  78  Resp:  18  18  Temp: 98.2 F (36.8 C)  98.2 F (36.8 C)   TempSrc: Oral  Oral   SpO2:      Weight:         Dilation: 7 Effacement (%): 100 Cervical Position: Middle Station: -1 Presentation: Vertex Exam by:: cNeil,cnm FHT: Baseline: 130 bpm; Variability: good (>6 bpm); Accelerations:present, variable; Decelerations: absent  Uterine activity: irregular contractions, every 2-4 min  Labs: RecentLabs       Lab Results  Component Value Date   WBC 5.5 12/28/2017   HGB 12.7 12/28/2017   HCT 36.6 12/28/2017   MCV 94.1 12/28/2017   PLT 189 12/28/2017          Patient Active Problem List   Diagnosis Date Noted  . Normal labor 12/28/2017  . [redacted] weeks gestation of pregnancy   . Advanced maternal age in multigravida, third trimester   . Encounter for other antenatal screening follow-up   . Supervision of high risk pregnancy in third trimester 08/30/2017  . Gestational diabetes 08/30/2017    Assessment / Plan: 32 y.o. G3P2002 at 6649w5d here for spontaneous onset of labor.  Labor: progressing well; AROM completed; continue to monitor normal labor Fetal Wellbeing: Cat I Pain Control:  Patient refused medication management; states pain managable Anticipated MOD:  SVD  Cyndee Brightlyonnor  M Belson, Medical Student 5/31/20193:42 PM  I confirm that I  have verified the information documented in the medical student's note and that I have also personally reperformed the physical exam and all medical decision making activities.  Patient declining pain medication and requesting AROM. Discussed with patient risks and benefits of AROM for augmentation of labor. Patient agreeable to plan of care. AROM performed with moderate amount of clear fluid. Patient tolerated procedure well.   Rolm BookbinderCaroline M Laine Giovanetti, CNM 12/28/17 3:51 PM

## 2017-12-28 NOTE — Progress Notes (Addendum)
G3P2 @ 40.[redacted] wksga. Here dt contractions that started around 1000. Progressively worse q4 mins. Denies LOF or bleeding. +FM.   VE 5-6/60/BB  GBS negative. Desires natural birth. Previous 2 SVD with no epidural   1208: Provider notified. In unit and will come out for report.  1210: report given to oncall. Provider for birthing. Ordered to admit  Pt to room 172

## 2017-12-28 NOTE — Anesthesia Pain Management Evaluation Note (Signed)
  CRNA Pain Management Visit Note  Patient: Kathleen Mcgee, 32 y.o., female  "Hello I am a member of the anesthesia team at [facility]. We have an anesthesia team available at all times to provide care throughout the hospital, including epidural management and anesthesia for C-section. I don't know your plan for the delivery whether it a natural birth, water birth, IV sedation, nitrous supplementation, doula or epidural, but we want to meet your pain goals."   1.Was your pain managed to your expectations on prior hospitalizations?   Yes   2.What is your expectation for pain management during this hospitalization?     Labor support without medications  3.How can we help you reach that goal? Be available  Record the patient's initial score and the patient's pain goal.   Pain: 5  Pain Goal: 10 [facility] wants you to be able to say your pain was always managed very well.  Edison Pace 12/28/2017

## 2017-12-28 NOTE — Progress Notes (Signed)
Admission education completed with Bonnye FavaViria the in house interpreter per FOB's request.  MOB and FOB voiced understanding and had no questions at this time.

## 2017-12-28 NOTE — MAU Note (Signed)
Contractions started a couple hours ago.  Getting closer.  Small amt of bleeding.  Was 4 cm when last checked. No problems with preg.

## 2017-12-28 NOTE — H&P (Signed)
OBSTETRIC ADMISSION HISTORY AND PHYSICAL  Kathleen Mcgee is a 32 y.o. female G3P2002 with IUP at 8119w5d by sure LMP presenting for spontaneous onset of labor. She reports +FMs, No LOF, no VB, no blurry vision, headaches or peripheral edema, and RUQ pain.  She plans on breast feeding. She request nothing for birth control. [facility]  Her pregnancy is complicated by A1GDM. She had limited prenatal visits throughout the pregnancy and missed her last 2 ROB appointments so her induction was not scheduled.  She is unsure of the size of her two previous babies, but denies any complications at delivery.   Dating: By LMP --->  Estimated Date of Delivery: 12/23/17  Sono:  12/14/17  @[redacted]w[redacted]d , CWD, normal anatomy, cephalic presentation, 3654g, 13%87% EFW   Clinic  [facility] Prenatal Labs  Dating LMP--sure Blood type: A/Positive/-- (11/26 0000)   Genetic Screen Declined Antibody: Negative  Anatomic US WNL Rubella: Immune (11/26 0000)  GTT Early: 160 3 hour 89, 186, 187      RPR: Nonreactive (11/26 0000)   Flu vaccine declined HBsAg: Negative (11/26 0000)   TDaP vaccine  declined                                          HIV: Non-reactive (11/26 0000)  Baby Food                                               GBS: negative  Contraception  KGM:WNUUVOZDGUYQIH--KVQQVPap:unsatisfactory--needs repeat  Circumcision    Pediatrician  ZD:GLOVFIEPCF:declined  Support Person  SMA  Prenatal Classes  Hgb electrophoresis:Nml AA    Prenatal History/Complications:  Past Medical History: Past Medical History:  Diagnosis Date  . Gestational diabetes   . Medical history non-contributory     Past Surgical History: Past Surgical History:  Procedure Laterality Date  . NO PAST SURGERIES      Obstetrical History: OB History    Gravida  3   Para  2   Term  2   Preterm      AB      Living  2     SAB      TAB      Ectopic      Multiple      Live  Births  2           Social History: Social History   Socioeconomic History  . Marital status: Married    Spouse name: Not on file  . Number of children: Not on file  . Years of education: Not on file  . Highest education level: Not on file  Occupational History  . Not on file  Social Needs  . Financial resource strain: Not on file  . Food insecurity:    Worry: Not on file    Inability: Not on file  . Transportation needs:    Medical: Not on file    Non-medical: Not on file  Tobacco Use  . Smoking status: Never Smoker  . Smokeless tobacco: Never Used  Substance and Sexual Activity  . Alcohol use: No    Frequency: Never  . Drug use: No  . Sexual activity: Not Currently  Birth control/protection: None  Lifestyle  . Physical activity:    Days per week: Not on file    Minutes per session: Not on file  . Stress: Not on file  Relationships  . Social connections:    Talks on phone: Not on file    Gets together: Not on file    Attends religious service: Not on file    Active member of club or organization: Not on file    Attends meetings of clubs or organizations: Not on file    Relationship status: Not on file  Other Topics Concern  . Not on file  Social History Narrative  . Not on file    Family History: No family history on file.  Allergies: No Known Allergies  Medications Prior to Admission  Medication Sig Dispense Refill Last Dose  . Prenatal Vit-Fe Fumarate-FA (PRENATAL VITAMIN PO) Take 1 tablet by mouth daily.    Past Week at Unknown time     Review of Systems   All systems reviewed and negative except as stated in HPI  Blood pressure 132/89, pulse 87, temperature 98.7 F (37.1 C), temperature source Oral, resp. rate 17, weight 169 lb 8 oz (76.9 kg), last menstrual period 03/18/2017, SpO2 99 %. General appearance: alert, cooperative and no distress Lungs: clear to auscultation bilaterally Heart: regular rate and rhythm Abdomen: soft,  non-tender; bowel sounds normal Pelvic: n/a Extremities: Homans sign is negative, no sign of DVT DTR's +2 Presentation: cephalic Fetal monitoringBaseline: 120 bpm, Variability: Good {> 6 bpm), Accelerations: Reactive and Decelerations: Absent Uterine activityFrequency: Every 2-4 minutes Dilation: 5.5 Effacement (%): 60 Exam by:: J McCelllan RNC   Prenatal labs: ABO, Rh: A/Positive/-- (11/26 0000) Antibody:   Rubella: Immune (11/26 0000) RPR: Non Reactive (05/06 1030)  HBsAg: Negative (11/26 0000)  HIV: Non Reactive (05/06 1030)  GBS: Negative (05/06 1108)   Prenatal Transfer Tool  Maternal Diabetes: Yes:  Diabetes Type:  Diet controlled Genetic Screening: Declined Maternal Ultrasounds/Referrals: Normal Fetal Ultrasounds or other Referrals:  None Maternal Substance Abuse:  No Significant Maternal Medications:  None Significant Maternal Lab Results: Lab values include: Group B Strep negative  No results found for this or any previous visit (from the past 24 hour(s)).  Patient Active Problem List   Diagnosis Date Noted  . [redacted] weeks gestation of pregnancy   . Advanced maternal age in multigravida, third trimester   . Encounter for other antenatal screening follow-up   . Supervision of high risk pregnancy in third trimester 08/30/2017  . Gestational diabetes 08/30/2017    Assessment/Plan:  Kathleen Mcgee is a 32 y.o. G3P2002 at [redacted]w[redacted]d here for spontaneous onset of labor  #Labor: Expectant management, anticipate NSVD #Pain: Per patient request #FWB: Cat 1 #ID:  GBS neg #MOF: Breast #MOC: None #Circ:  no  Rolm Bookbinder, CNM  12/28/2017, 12:19 PM

## 2017-12-28 NOTE — Progress Notes (Addendum)
Pt offered pain relief options, pt states she does not want anything. Pt appears to be in moderate pain, breathing with contractions.  Offered iv pain medications and epidural--pt states "nada", nothing.  Pt had FOB sign interpreter consent to waive interpreter services. Call bell reviewed w/fob and pt. Both state understanding.  Given 4oz juice to drink. Has not eaten since 5/30 2200.

## 2017-12-28 NOTE — Lactation Note (Signed)
This note was copied from a baby's chart. Lactation Consultation Note  Patient Name: Kathleen Mcgee UEAVW'UToday's Date: 12/28/2017 Reason for consult: Initial assessment;Term  6 hours old FT female who is being exclusively BF by his mother, she's a P3 and experienced BF. She was able to BF her other children for 6 months, but her youngest child is 34 y.o and mom voiced she had forgotten a lot of things regarding the art of BF. She does know how to hand express though, [facility]. Mom is a Insurance claims handlerpanish speaker and she asked for assistance about Medicaid and to fill out a birth certificate form. LC let RN know that mom needed to see a SW, RN will schedule a visit with SW. Guidance about how to fill the birth certificate form was also provided as well as an extra form.  Baby was swaddled on mom's bed when entering the room, offered assistance with latch but mom politely declined stating baby already fed. A few minutes later baby started to cue, asked mom again if she needed assistance and she said yes. LC took baby STS to mom's right breast in football position, mom needed lots of assistance in order to achieve a deep latch. Once baby was latched, a few swallows were heard and he was able to complete a full 15 minutes feeding. Baby still nursing when exiting the room.  Mom tried to latch baby at first laying on her side but baby had a shallow latch and his body was not aligned. Explained to mom the importance of a deep latch for milk transfer and for the prevention of sore nipples; especially since baby's face is still bruise. Mom is aware that baby is at risk of jaundice; she was willing to pump. Set mom up with a DEBP, pump instructions, cleaning and storage was reviewed; as well as milk storage guidelines.  Encouraged mom to keep feeding baby 8-12 times/24 hours or sooner if feeding cues are present. If baby is not cueing in a 3 hour period;  mom will place him STS to the breast to give him an opportunity to feed. She'll pump every 3 hours after feedings and will supplement baby with her EBM, either finger feeding or syringe feeding. BF brochure (SP), BF resources and feeding diary (SP) were discussed. Both parents are aware of LC services and will call PRN.  Maternal Data Formula Feeding for Exclusion: No Has patient been taught Hand Expression?: Yes Does the patient have breastfeeding experience prior to this delivery?: Yes  Feeding Feeding Type: Breast Fed Length of feed: 15 min(baby still nursing when exiting the room)  LATCH Score Latch: Grasps breast easily, tongue down, lips flanged, rhythmical sucking.  Audible Swallowing: A few with stimulation  Type of Nipple: Everted at rest and after stimulation  Comfort (Breast/Nipple): Soft / non-tender  Hold (Positioning): Assistance needed to correctly position infant at breast and maintain latch.  LATCH Score: 8  Interventions Interventions: Breast feeding basics reviewed;Assisted with latch;Skin to skin;Breast massage;Hand express;Breast compression;Adjust position;Support pillows;DEBP  Lactation Tools Discussed/Used Tools: Pump Breast pump type: Double-Electric Breast Pump WIC Program: Yes Pump Review: Setup, frequency, and cleaning;Milk Storage Initiated by:: MPeck Date initiated:: 12/28/17   Consult Status Consult Status: Follow-up Date: 12/29/17 Follow-up type: In-patient    Kathleen Mcgee Kathleen Mcgee 12/28/2017, 10:49 PM

## 2017-12-29 ENCOUNTER — Encounter (HOSPITAL_COMMUNITY): Payer: Self-pay | Admitting: *Deleted

## 2017-12-29 LAB — CBC
HCT: 32.1 % — ABNORMAL LOW (ref 36.0–46.0)
HEMOGLOBIN: 11.1 g/dL — AB (ref 12.0–15.0)
MCH: 32.6 pg (ref 26.0–34.0)
MCHC: 34.6 g/dL (ref 30.0–36.0)
MCV: 94.1 fL (ref 78.0–100.0)
Platelets: 187 10*3/uL (ref 150–400)
RBC: 3.41 MIL/uL — ABNORMAL LOW (ref 3.87–5.11)
RDW: 15.6 % — AB (ref 11.5–15.5)
WBC: 9.7 10*3/uL (ref 4.0–10.5)

## 2017-12-29 LAB — RPR: RPR: NONREACTIVE

## 2017-12-29 LAB — GLUCOSE, CAPILLARY: Glucose-Capillary: 91 mg/dL (ref 65–99)

## 2017-12-29 NOTE — Progress Notes (Signed)
Post Partum Day 1 Subjective: no complaints, up ad lib, voiding and tolerating PO  Objective: Blood pressure 113/69, pulse 69, temperature 98.6 F (37 C), temperature source Tympanic, resp. rate 18, weight 76.9 kg (169 lb 8 oz), last menstrual period 03/18/2017, SpO2 99 %, unknown if currently breastfeeding.  Physical Exam:  General: alert, cooperative and no distress Lochia: appropriate Uterine Fundus: firm DVT Evaluation: No evidence of DVT seen on physical exam. No cords or calf tenderness. No significant calf/ankle edema.  Recent Labs    12/28/17 1252 12/29/17 0524  HGB 12.7 11.1*  HCT 36.6 32.1*    Assessment/Plan: Desires discharge today but not 24hrs yet. Will leave up to peds.  Breastfeeding Fasting CBG this AM    LOS: 1 day   Kathleen AdaJazma Aoki Wedemeyer, DO 12/29/2017, 7:28 AM

## 2017-12-29 NOTE — Progress Notes (Signed)
CSW received consult for patient due to her requesting community resources. CSW spoke with patient using interpreter # [phone], Lawanna Kobusngel to discuss current needs with patient. Patient requesting general resource list, CSW will provide patient with comprehensive list and information to access DSS for [facility]. Patient stated agreement and gratitude for CSW interaction.  CSW to deliver resource list directly to patient's room.  Edwin Dadaarol Gerilyn Stargell, MSW, LCSW-A Clinical Social Worker [facility] [phone]

## 2017-12-30 MED ORDER — IBUPROFEN 600 MG PO TABS: 600.0000 mg | ORAL_TABLET | Freq: Four times a day (QID) | ORAL | 0 refills | Status: DC

## 2017-12-30 NOTE — Discharge Instructions (Signed)
Vaginal Delivery, Care After °Refer to this sheet in the next few weeks. These instructions provide you with information about caring for yourself after vaginal delivery. Your health care provider may also give you more specific instructions. Your treatment has been planned according to current medical practices, but problems sometimes occur. Call your health care provider if you have any problems or questions. °What can I expect after the procedure? °After vaginal delivery, it is common to have: °· Some bleeding from your vagina. °· Soreness in your abdomen, your vagina, and the area of skin between your vaginal opening and your anus (perineum). °· Pelvic cramps. °· Fatigue. ° °Follow these instructions at home: °Medicines °· Take over-the-counter and prescription medicines only as told by your health care provider. °· If you were prescribed an antibiotic medicine, take it as told by your health care provider. Do not stop taking the antibiotic until it is finished. °Driving ° °· Do not drive or operate heavy machinery while taking prescription pain medicine. °· Do not drive for 24 hours if you received a sedative. °Lifestyle °· Do not drink alcohol. This is especially important if you are breastfeeding or taking medicine to relieve pain. °· Do not use tobacco products, including cigarettes, chewing tobacco, or e-cigarettes. If you need help quitting, ask your health care provider. °Eating and drinking °· Drink at least 8 eight-ounce glasses of water every day unless you are told not to by your health care provider. If you choose to breastfeed your baby, you may need to drink more water than this. °· Eat high-fiber foods every day. These foods may help prevent or relieve constipation. High-fiber foods include: °? Whole grain cereals and breads. °? Brown rice. °? Beans. °? Fresh fruits and vegetables. °Activity °· Return to your normal activities as told by your health care provider. Ask your health care provider  what activities are safe for you. °· Rest as much as possible. Try to rest or take a nap when your baby is sleeping. °· Do not lift anything that is heavier than your baby or 10 lb (4.5 kg) until your health care provider says that it is safe. °· Talk with your health care provider about when you can engage in sexual activity. This may depend on your: °? Risk of infection. °? Rate of healing. °? Comfort and desire to engage in sexual activity. °Vaginal Care °· If you have an episiotomy or a vaginal tear, check the area every day for signs of infection. Check for: °? More redness, swelling, or pain. °? More fluid or blood. °? Warmth. °? Pus or a bad smell. °· Do not use tampons or douches until your health care provider says this is safe. °· Watch for any blood clots that may pass from your vagina. These may look like clumps of dark red, brown, or black discharge. °General instructions °· Keep your perineum clean and dry as told by your health care provider. °· Wear loose, comfortable clothing. °· Wipe from front to back when you use the toilet. °· Ask your health care provider if you can shower or take a bath. If you had an episiotomy or a perineal tear during labor and delivery, your health care provider may tell you not to take baths for a certain length of time. °· Wear a bra that supports your breasts and fits you well. °· If possible, have someone help you with household activities and help care for your baby for at least a few days after   you leave the hospital. °· Keep all follow-up visits for you and your baby as told by your health care provider. This is important. °Contact a health care provider if: °· You have: °? Vaginal discharge that has a bad smell. °? Difficulty urinating. °? Pain when urinating. °? A sudden increase or decrease in the frequency of your bowel movements. °? More redness, swelling, or pain around your episiotomy or vaginal tear. °? More fluid or blood coming from your episiotomy or  vaginal tear. °? Pus or a bad smell coming from your episiotomy or vaginal tear. °? A fever. °? A rash. °? Little or no interest in activities you used to enjoy. °? Questions about caring for yourself or your baby. °· Your episiotomy or vaginal tear feels warm to the touch. °· Your episiotomy or vaginal tear is separating or does not appear to be healing. °· Your breasts are painful, hard, or turn red. °· You feel unusually sad or worried. °· You feel nauseous or you vomit. °· You pass large blood clots from your vagina. If you pass a blood clot from your vagina, save it to show to your health care provider. Do not flush blood clots down the toilet without having your health care provider look at them. °· You urinate more than usual. °· You are dizzy or light-headed. °· You have not breastfed at all and you have not had a menstrual period for 12 weeks after delivery. °· You have stopped breastfeeding and you have not had a menstrual period for 12 weeks after you stopped breastfeeding. °Get help right away if: °· You have: °? Pain that does not go away or does not get better with medicine. °? Chest pain. °? Difficulty breathing. °? Blurred vision or spots in your vision. °? Thoughts about hurting yourself or your baby. °· You develop pain in your abdomen or in one of your legs. °· You develop a severe headache. °· You faint. °· You bleed from your vagina so much that you fill two sanitary pads in one hour. °This information is not intended to replace advice given to you by your health care provider. Make sure you discuss any questions you have with your health care provider. °Document Released: 07/14/2000 Document Revised: 12/29/2015 Document Reviewed: 08/01/2015 °Elsevier Interactive Patient Education © 2018 [employer] ° °

## 2017-12-30 NOTE — Lactation Note (Signed)
This note was copied from a baby's chart. Lactation Consultation Note  Patient Name: Boy Berenice BoutonViridiana Hernandez-Medrano WUJWJ'XToday's Date: 12/30/2017 Reason for consult: Follow-up assessment  Visited with P3 Mom of term baby at 3340 hrs old, and at 6% weight loss..  Output- 3 voids and 3 stools last 24 hrs. Mom breastfeeding baby with a deep latch.  Baby had been feeding for 20 mins.  Mom using scissor hold.  Demonstrated using 4 fingers under breast and thumb above, holding breast at base of breast.  Baby dressed and swaddled.  Talked about importance of keeping baby STS, as baby will be more alert and active nursing.   Encouraged STS, and cue based feedings, goal of 8-12 feedings per 24 hrs.   Mom aware of OP lactation support.  Mom encouraged to call with any questions.  Consult Status Consult Status: Complete Date: 12/30/17 Follow-up type: Call as needed    Judee ClaraSmith, Trevino Wyatt E 12/30/2017, 9:01 AM

## 2017-12-30 NOTE — Discharge Summary (Signed)
OB Discharge Summary     Patient Name: Kathleen Mcgee DOB: 23-May-1986 MRN: [id number]  Date of admission: 12/28/2017 Delivering MD: Rolm Bookbinder   Date of discharge: 12/30/2017  Admitting diagnosis: LABOR Intrauterine pregnancy: [redacted]w[redacted]d     Secondary diagnosis:  Active Problems:   Normal labor  Additional problems: Shoulder dystocia     Discharge diagnosis: Term Pregnancy Delivered and GDM A1                                                                                                Post partum procedures:n/a  Augmentation: AROM  Complications: None  Hospital course:  Onset of Labor With Vaginal Delivery     32 y.o. yo G3P3003 at [redacted]w[redacted]d was admitted in Active Labor on 12/28/2017. Patient had a labor course complicated by a shoulder dystocia and otherwise normal as follows:  Membrane Rupture Time/Date: 3:27 PM ,12/28/2017   Intrapartum Procedures: Episiotomy: None [1]                                         Lacerations:  None [1]  Patient had a delivery of a Viable infant. 12/28/2017  Information for the patient's newborn:  Dalesha, Stanback [[id number]]  Delivery Method: Vag-Spont   See delivery note for details of shoulder dystocia  Pateint had an uncomplicated postpartum course.  She is ambulating, tolerating a regular diet, passing flatus, and urinating well. Patient is discharged home in stable condition on 12/30/17.   Physical exam  Vitals:   12/29/17 0556 12/29/17 1459 12/29/17 1719 12/30/17 0557  BP: 113/69 116/72 112/73 106/77  Pulse: 69 70 75 71  Resp: 18 20 18 18   Temp: 98.6 F (37 C) 98.6 F (37 C) 97.8 F (36.6 C) 97.7 F (36.5 C)  TempSrc: Tympanic Oral Oral Oral  SpO2:  98% 99% 91%  Weight:       General: alert, cooperative and no distress Lochia: appropriate Uterine Fundus: firm Incision: N/A DVT Evaluation: No evidence of DVT seen on physical exam. Labs: Lab Results  Component Value Date   WBC 9.7 12/29/2017    HGB 11.1 (L) 12/29/2017   HCT 32.1 (L) 12/29/2017   MCV 94.1 12/29/2017   PLT 187 12/29/2017   CMP Latest Ref Rng & Units 08/30/2017  Glucose 65 - 99 mg/dL 865(H)  BUN 6 - 20 mg/dL 6  Creatinine 8.46 - 9.62 mg/dL 9.52(W)  Sodium 413 - 244 mmol/L 140  Potassium 3.5 - 5.2 mmol/L 4.0  Chloride 96 - 106 mmol/L 104  CO2 20 - 29 mmol/L 20  Calcium 8.7 - 10.2 mg/dL 9.2  Total Protein 6.0 - 8.5 g/dL 6.2  Total Bilirubin 0.0 - 1.2 mg/dL <0.1  Alkaline Phos 39 - 117 IU/L 88  AST 0 - 40 IU/L 8  ALT 0 - 32 IU/L 8    Discharge instruction: per After Visit Summary and "Baby and Me Booklet".  After visit meds:  Allergies as of 12/30/2017   No Known Allergies  Medication List    TAKE these medications   ibuprofen 600 MG tablet Commonly known as:  ADVIL,MOTRIN Take 1 tablet (600 mg total) by mouth every 6 (six) hours.   PRENATAL VITAMIN PO Take 1 tablet by mouth daily.       Diet: carb modified diet  Activity: Advance as tolerated. Pelvic rest for 6 weeks.   Outpatient follow up:4 weeks Follow up Appt:No future appointments. Follow up Visit:No follow-ups on file.  Postpartum contraception: None  Newborn Data: Live born female  Birth Weight: 9 lb 1.3 oz (4119 g) APGAR: 1, 9  Newborn Delivery   Birth date/time:  12/28/2017 16:17:00 Delivery type:  Vaginal, Spontaneous     Baby Feeding: Breast Disposition:home with mother   12/30/2017 Rolm Bookbinderaroline M Niesha Bame, CNM

## 2017-12-30 NOTE — Progress Notes (Signed)
Discharge instructions reviewed with patient via hospital interpreter.  Answered any questions or concerns.  No further questions at this time.

## 2018-01-24 ENCOUNTER — Encounter: Payer: Self-pay | Admitting: Obstetrics & Gynecology

## 2018-04-06 IMAGING — US US MFM OB DETAIL+14 WK
1 series · 14 of 28 positions shown · non-contrast
Comparison: none

[Series 1: us mfm ob detail+14 wk · 91 acquisitions, 14 frames shown]
[im 4/91]
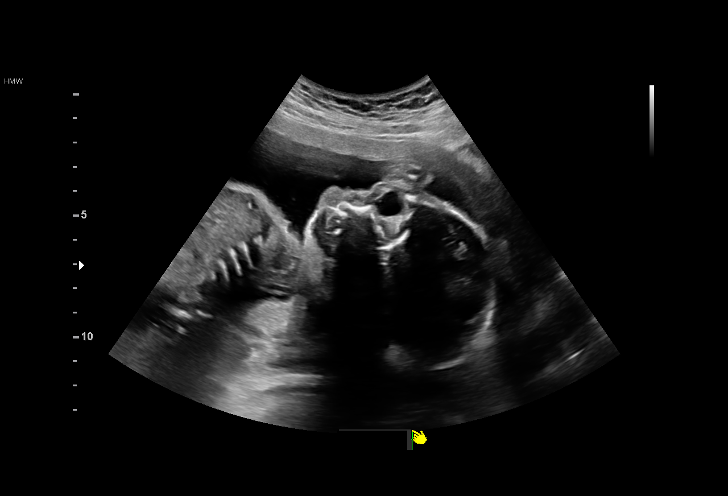
[im 11/91]
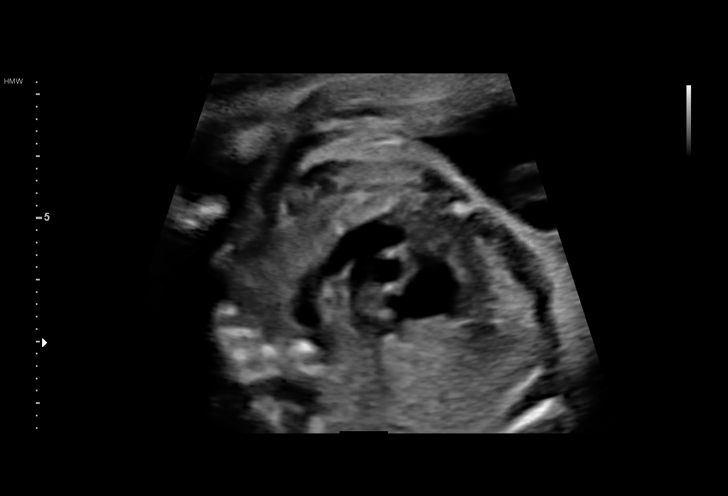
[im 17/91]
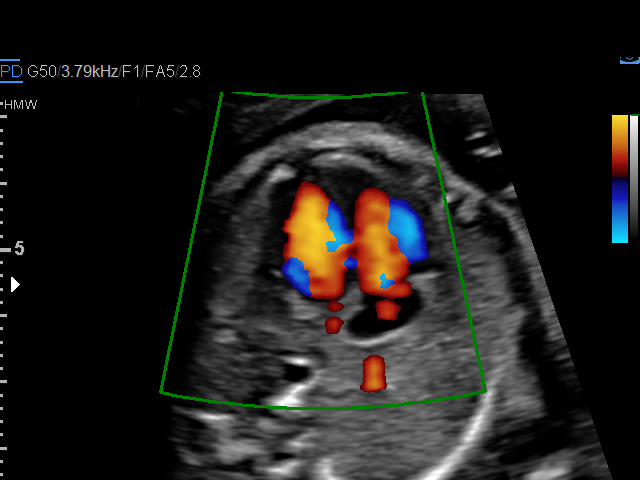
[im 24/91]
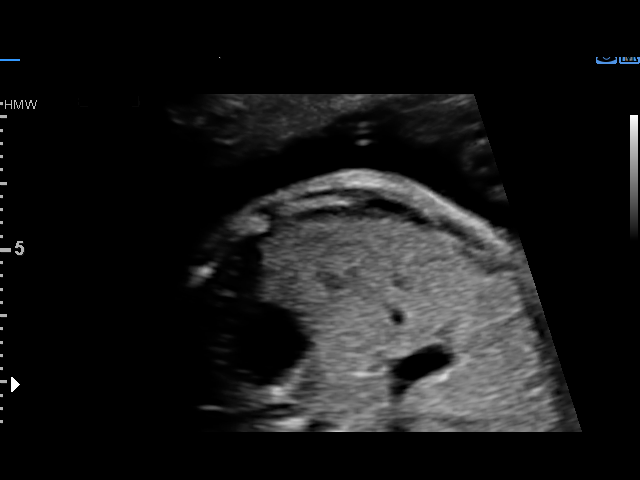
[im 31/91]
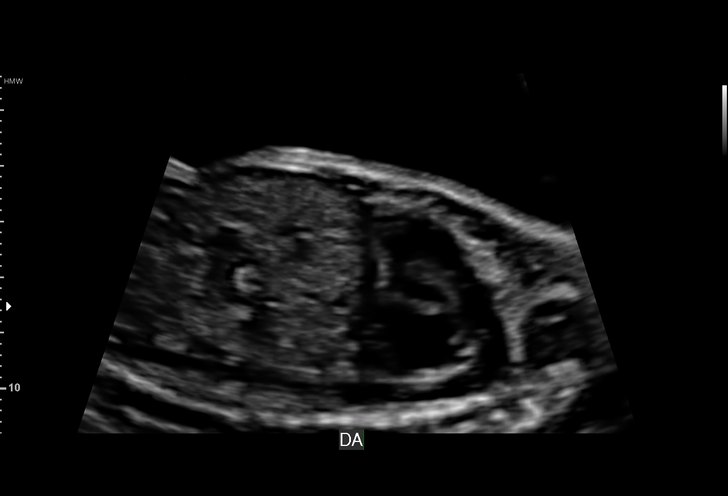
[im 37/91]
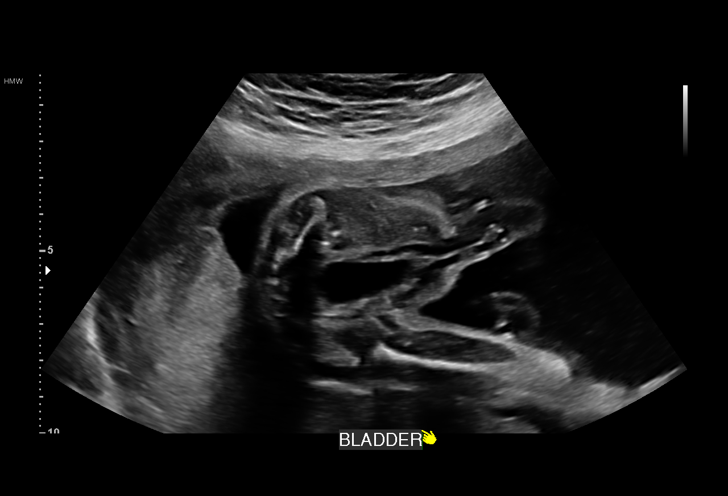
[im 44/91]
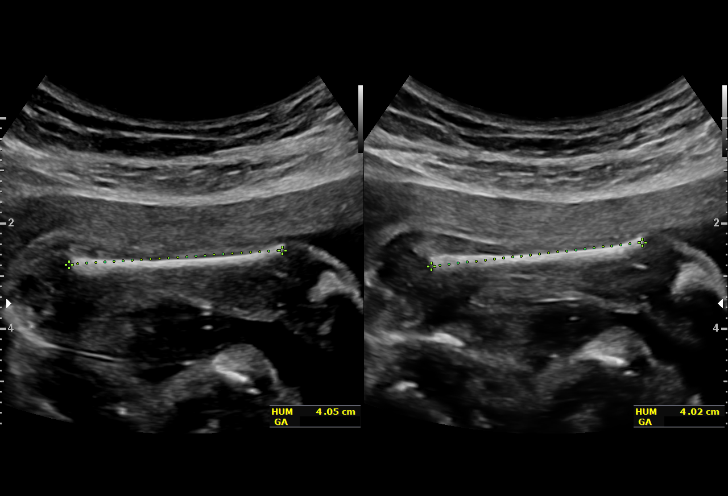
[im 51/91]
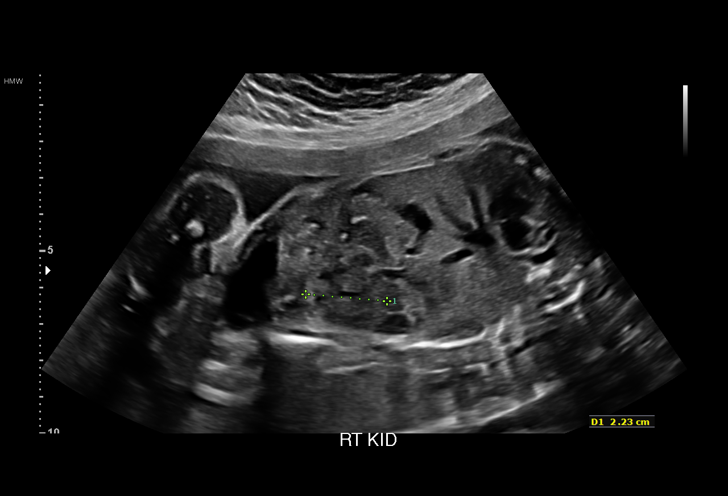
[im 57/91]
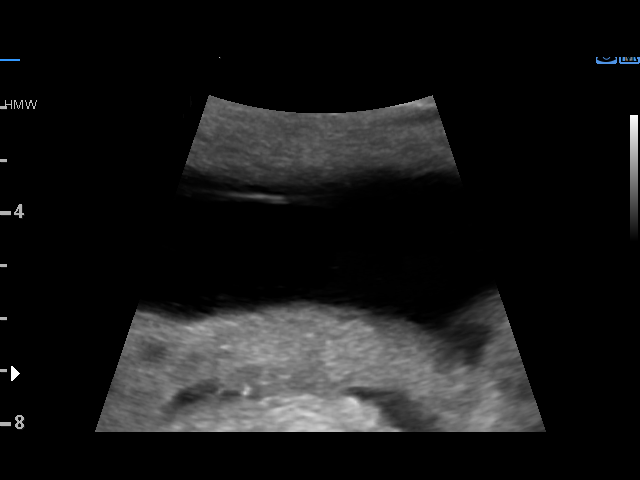
[im 64/91]
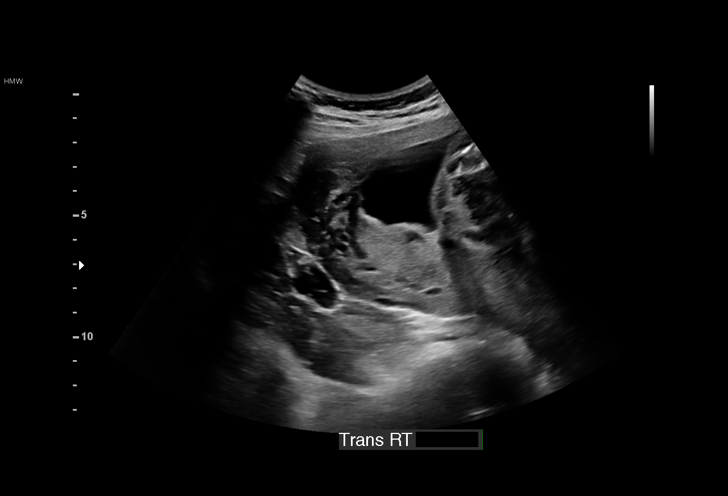
[im 71/91]
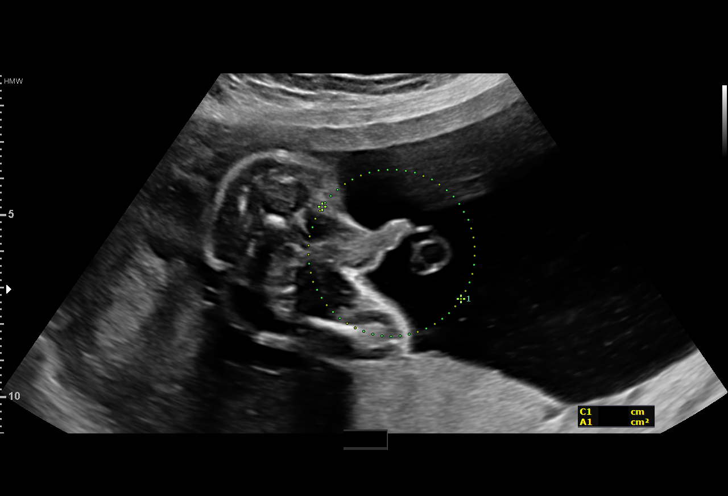
[im 77/91]
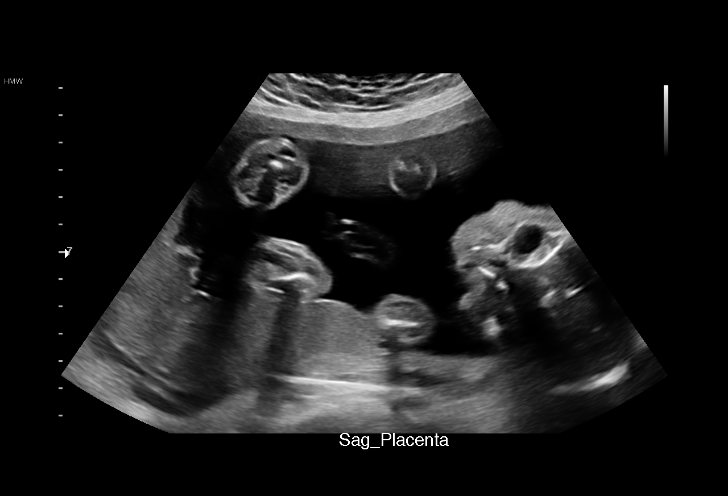
[im 84/91]
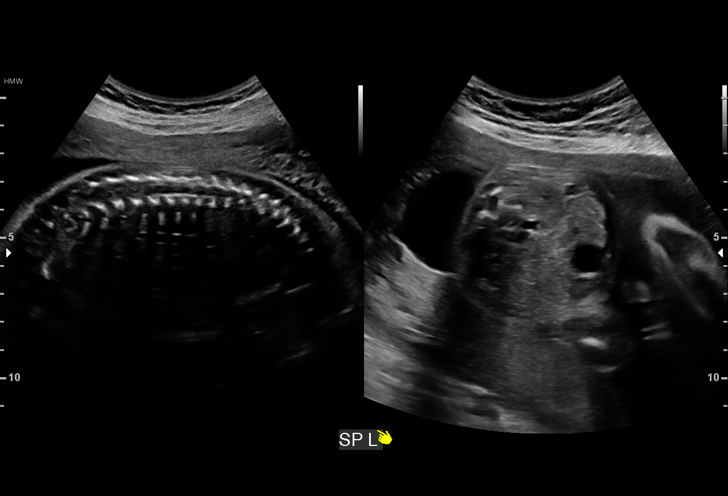
[im 91/91]
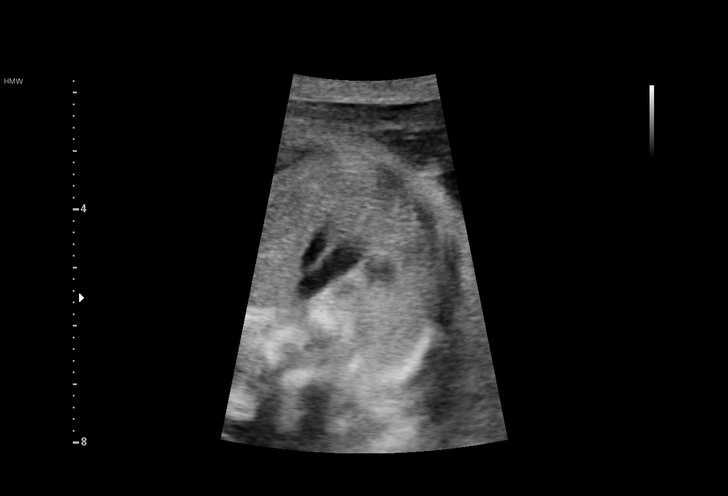

[14 of 28 positions shown; findings below may reference images not displayed]

[facility]

[facility] Clinic

1  VIM TE FISCHER              [id number]      [id number]     [id number]
Indications

24 weeks gestation of pregnancy
Encounter for antenatal screening for
malformations
Gestational diabetes in pregnancy, diet
controlled
OB History

Blood Type:            Height:  5'1"   Weight (lb):  156       BMI:
Gravidity:    3         Term:   2        Prem:   0        SAB:   0
TOP:          0       Ectopic:  0        Living: 2
Fetal Evaluation

Num Of Fetuses:     1
Fetal Heart         143
Rate(bpm):
Cardiac Activity:   Observed
Presentation:       Cephalic
Placenta:           Fundal, above cervical os
P. Cord Insertion:  Visualized, central

Amniotic Fluid
AFI FV:      Subjectively within normal limits
Biometry
BPD:      61.6  mm     G. Age:  25w 0d         70  %    CI:        76.82   %    70 - 86
FL/HC:      20.5   %    18.7 -
HC:      222.6  mm     G. Age:  24w 2d         32  %    HC/AC:      1.10        1.05 -
AC:      202.1  mm     G. Age:  24w 6d         58  %    FL/BPD:     74.0   %    71 - 87
FL:       45.6  mm     G. Age:  25w 1d         63  %    FL/AC:      22.6   %    20 - 24
HUM:      40.4  mm     G. Age:  24w 4d         48  %

Est. FW:     746  gm    1 lb 10 oz      64  %
Gestational Age

LMP:           24w 2d        Date:  03/18/17                 EDD:   12/23/17
U/S Today:     24w 6d                                        EDD:   12/19/17
Best:          24w 2d     Det. By:  LMP  (03/18/17)          EDD:   12/23/17
Anatomy

Cranium:               Appears normal         Aortic Arch:            Not well visualized
Cavum:                 Appears normal         Ductal Arch:            Appears normal
Ventricles:            Appears normal         Diaphragm:              Appears normal
Choroid Plexus:        Appears normal         Stomach:                Appears normal, left
sided
Cerebellum:            Appears normal         Abdomen:                Appears normal
Posterior Fossa:       Appears normal         Abdominal Wall:         Appears nml (cord
insert, abd wall)
Nuchal Fold:           Not applicable (>20    Cord Vessels:           Appears normal (3
wks GA)                                        vessel cord)
Face:                  Appears normal         Kidneys:                Appear normal
(orbits and profile)
Lips:                  Appears normal         Bladder:                Appears normal
Thoracic:              Appears normal         Spine:                  Appears normal
Heart:                 Appears normal         Upper Extremities:      Appears normal
(4CH, axis, and situs
RVOT:                  Appears normal         Lower Extremities:      Appears normal
LVOT:                  Appears normal

Other:  Fetus appears to be a male. Heels and 5th digit visualized. Nasal
bone visualized. Technically difficult due to fetal position.
Cervix Uterus Adnexa

Cervix
Length:              4  cm.
Normal appearance by transabdominal scan.

Uterus
No abnormality visualized.

Left Ovary
No adnexal mass visualized.

Right Ovary
No adnexal mass visualized.

Cul De Sac:   No free fluid seen.

Adnexa:       No abnormality visualized.
Impression

Singleton intrauterine pregnancy at 24 weeks 2 days
gestation with fetal cardiac activity
Cephalic presentation
Normal detailed fetal anatomy; limited views of the AA
Markers of aneuploidy: none
Normal amniotic fluid volume
Measurements consistent with LMP dating; EFW at the 64th
%tile
Fundal placenta
Normal appearing cervical length
Recommendations

Consider growth US in third trimester depending on glucose
control

## 2018-07-16 IMAGING — US US MFM OB FOLLOW-UP
1 series · 14 of 28 positions shown · non-contrast
Comparison: none

[Series 1: us mfm ob follow-up · 14 of 30 slices shown]
[im 2/30]
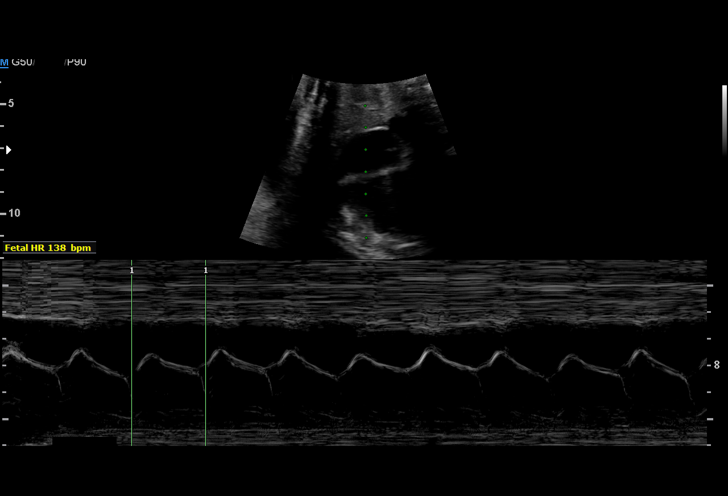
[im 4/30]
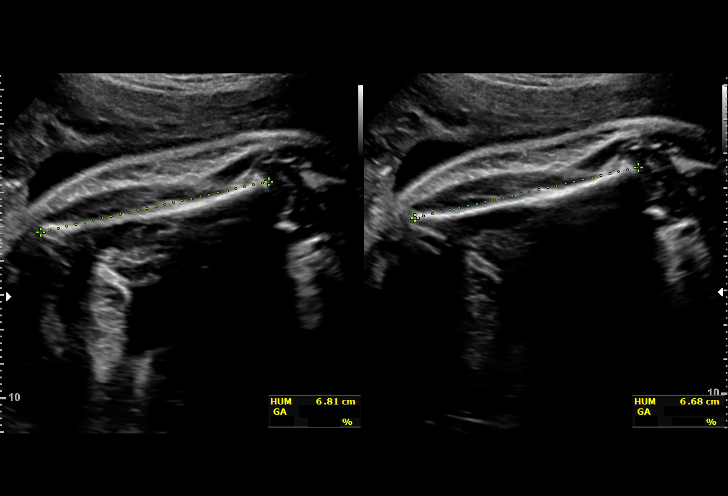
[im 6/30]
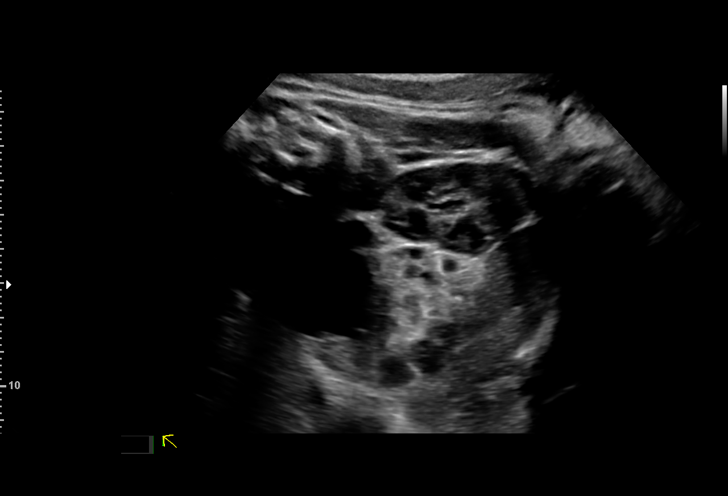
[im 8/30]
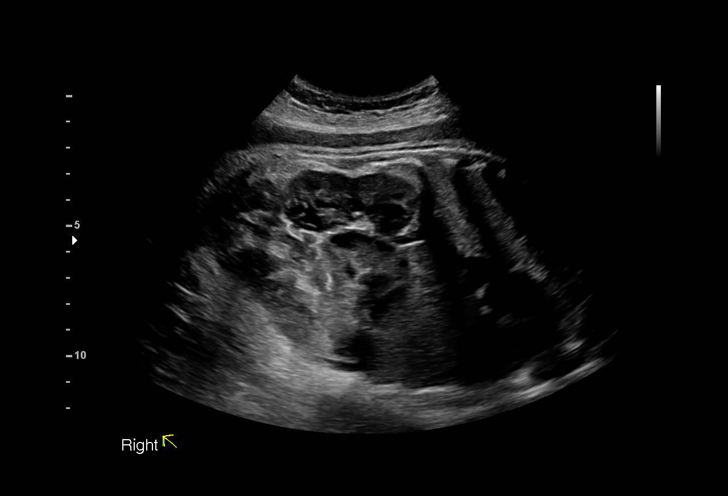
[im 10/30]
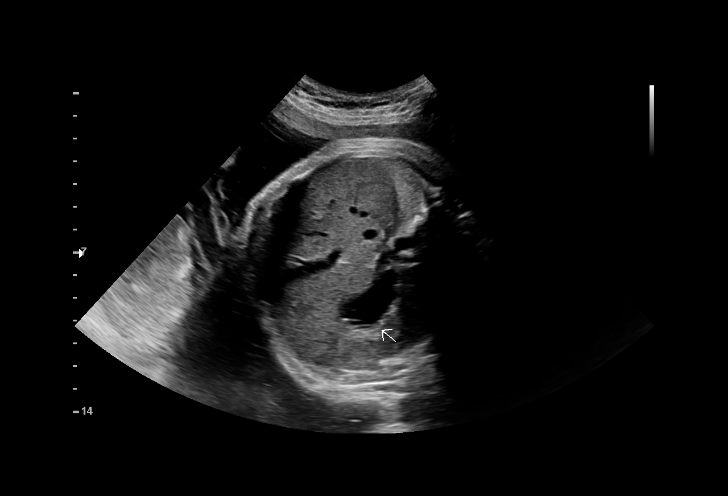
[im 12/30]
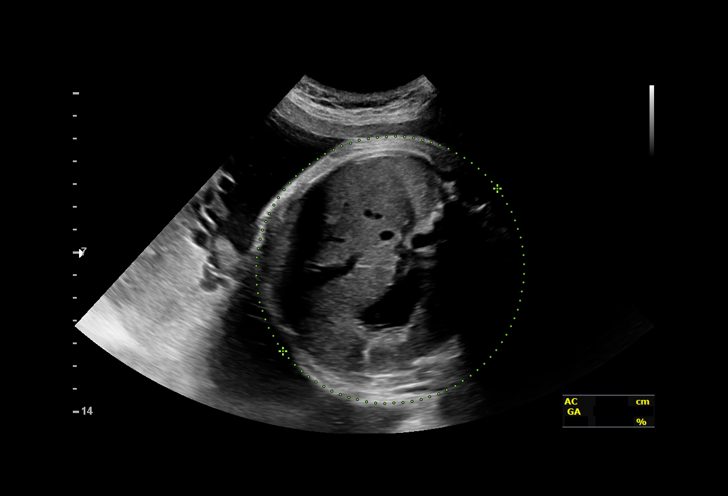
[im 14/30]
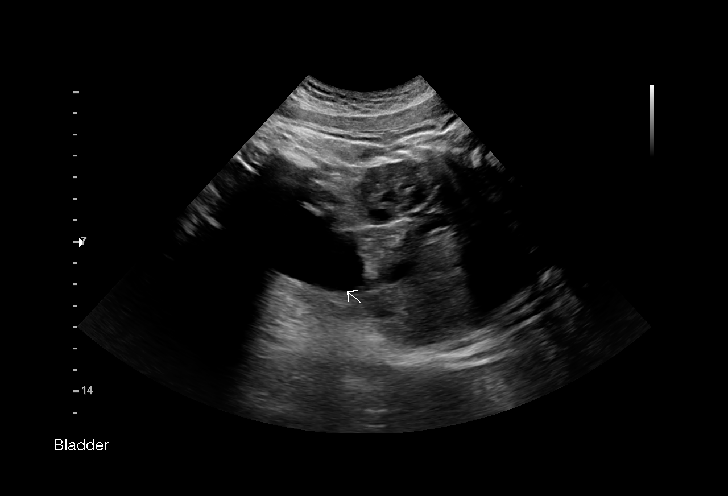
[im 17/30]
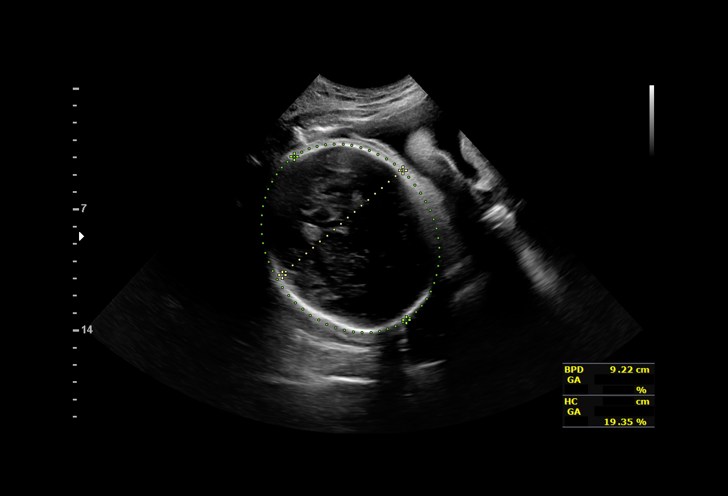
[im 19/30]
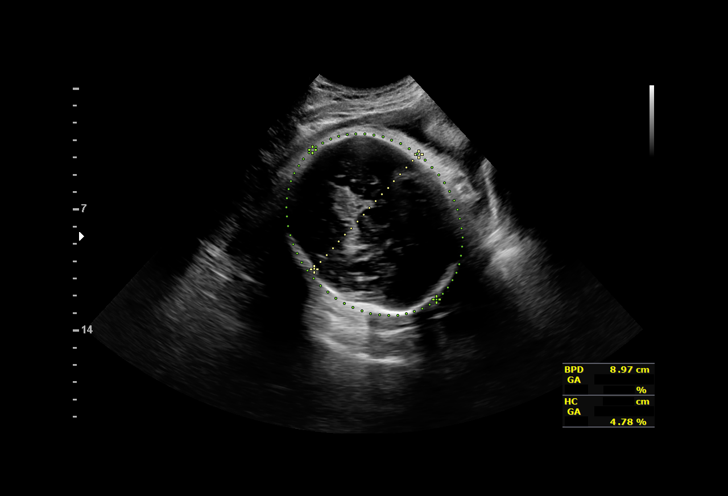
[im 21/30]
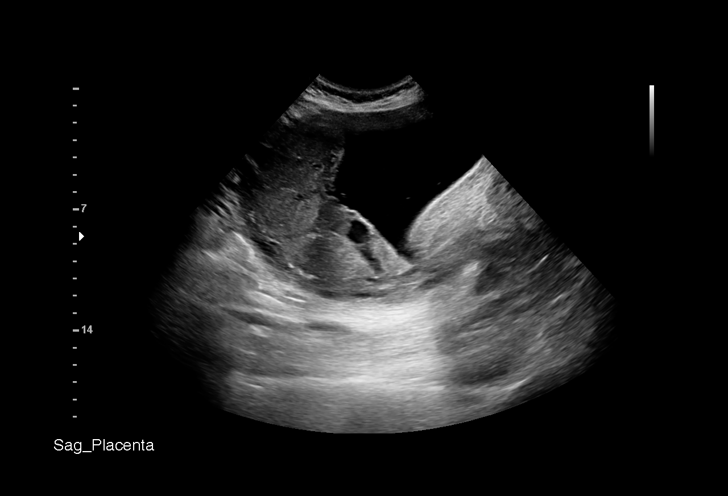
[im 23/30]
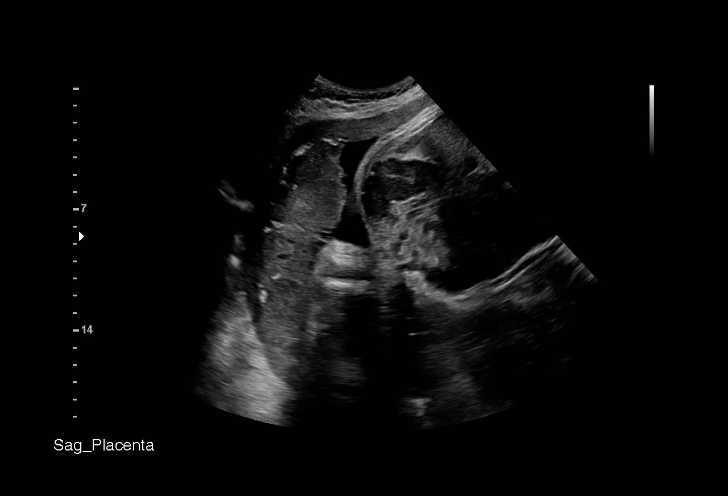
[im 25/30]
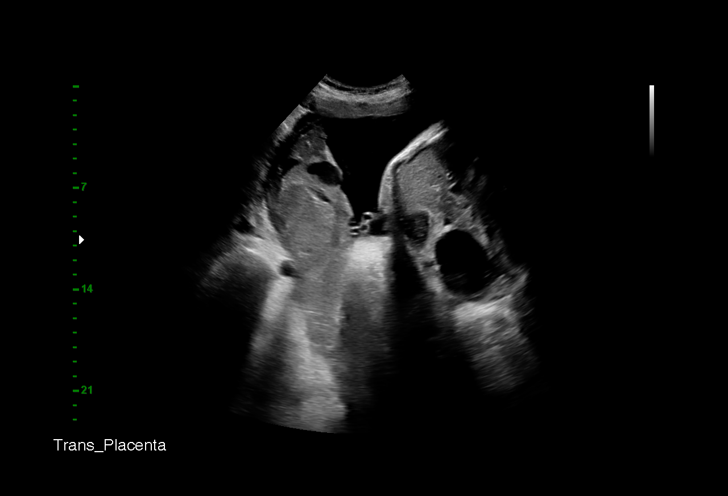
[im 27/30]
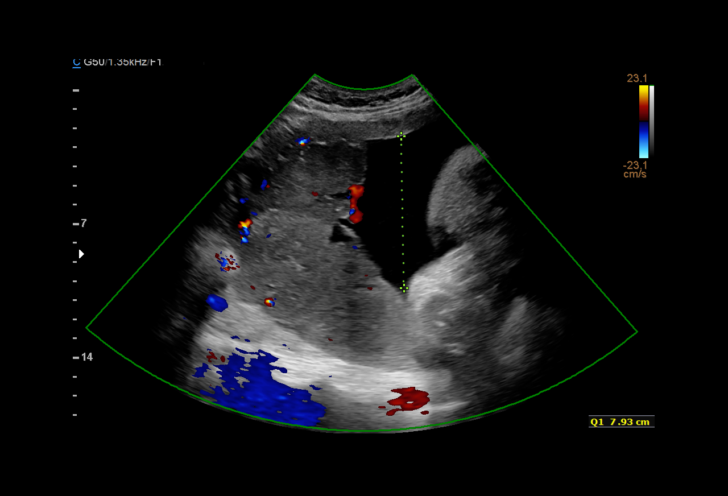
[im 30/30]
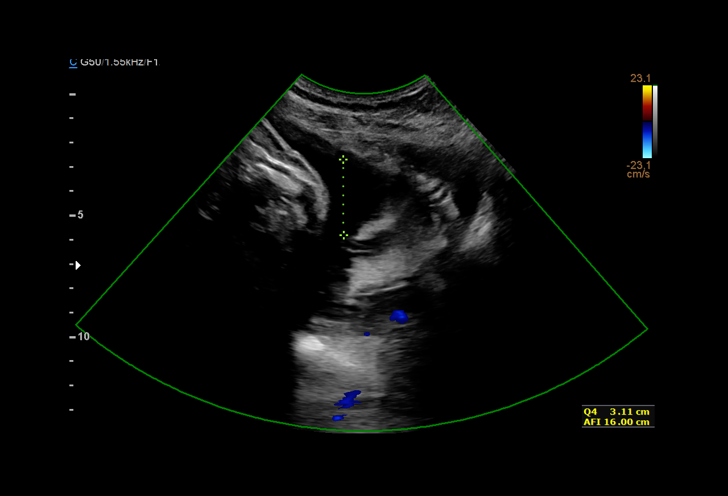

[14 of 28 positions shown; findings below may reference images not displayed]

[facility]

OB/Gyn Clinic

1  GRUPO COHEN                [id number]      [id number]     [id number]
Indications

38 weeks gestation of pregnancy
Gestational diabetes in pregnancy, diet
controlled
Advanced maternal age multigravida 35+,
third trimester
Encounter for other antenatal screening
follow-up
OB History

Blood Type:            Height:  5'1"   Weight (lb):  156       BMI:
Gravidity:    3         Term:   2        Prem:   0        SAB:   0
TOP:          0       Ectopic:  0        Living: 2
Fetal Evaluation

Num Of Fetuses:     1
Fetal Heart         138
Rate(bpm):
Cardiac Activity:   Observed
Presentation:       Cephalic
Placenta:           Fundal, posterior above cervical os
P. Cord Insertion:  Previously Visualized
Amniotic Fluid
AFI FV:      Subjectively within normal limits

AFI Sum(cm)     %Tile       Largest Pocket(cm)
16.01           64

RUQ(cm)       RLQ(cm)       LUQ(cm)        LLQ(cm)
7.93
Biometry

BPD:        91  mm     G. Age:  36w 6d         30  %    CI:        76.68   %    70 - 86
FL/HC:      22.8   %    20.6 -
HC:      329.2  mm     G. Age:  37w 3d         12  %    HC/AC:      0.91        0.87 -
AC:      361.8  mm     G. Age:  40w 1d         93  %    FL/BPD:     82.4   %    71 - 87
FL:         75  mm     G. Age:  38w 3d         47  %    FL/AC:      20.7   %    20 - 24
HUM:      67.4  mm     G. Age:  39w 1d         83  %

Est. FW:    0773  gm      8 lb 1 oz     87  %
Gestational Age

LMP:           38w 5d        Date:  03/18/17                 EDD:   12/23/17
U/S Today:     38w 2d                                        EDD:   12/26/17
Best:          38w 5d     Det. By:  LMP  (03/18/17)          EDD:   12/23/17
Anatomy

Cranium:               Appears normal         Aortic Arch:            Not well visualized
Cavum:                 Previously seen        Ductal Arch:            Previously seen
Ventricles:            Previously seen        Diaphragm:              Previously seen
Choroid Plexus:        Previously seen        Stomach:                Appears normal, left
sided
Cerebellum:            Previously seen        Abdomen:                Previously seen
Posterior Fossa:       Previously seen        Abdominal Wall:         Previously seen
Nuchal Fold:           Not applicable (>20    Cord Vessels:           Previously seen
wks GA)
Face:                  Orbits and profile     Kidneys:                Appear normal
previously seen
Lips:                  Previously seen        Bladder:                Appears normal
Thoracic:              Appears normal         Spine:                  Previously seen
Heart:                 Previously seen        Upper Extremities:      Previously seen
RVOT:                  Previously seen        Lower Extremities:      Previously seen
LVOT:                  Previously seen

Other:  Fetus appears to be a male prev seen. Heels and 5th digit prev
visualized. Nasal bone prev visualized. Technically difficult due to
advanced GA and fetal position.
Cervix Uterus Adnexa

Cervix
Not visualized (advanced GA >06wks)
Impression

Single living intrauterine pregnancy at 38w 5d.
Cephalic presentation.
Placenta Fundal, posterior above cervical os.
Normal amniotic fluid volume.
Appropriate interval fetal growth.
Normal interval fetal anatomy.
Recommendations

Follow-up ultrasounds as clinically indicated.

## 2019-08-01 NOTE — L&D Delivery Note (Signed)
[facility]N [facility] Delivery Note  Kathleen Mcgee is a 34 y.o. G2R4270 s/p SVD at [redacted]w[redacted]d. She was admitted for SOL.   ROM: 0h 75m with clear fluid GBS Status: Unknown, received Amp  Maximum Maternal Temperature: 98.28F  Labor Progress: . Initial SVE: 3.5/80. She then quickly progressed to complete.   Delivery Date/Time: 9/14 at 1500 Delivery: Called to room and patient was complete and pushing. Head delivered ROA by Raelyn Mora. No nuchal cord present. Shoulder and body delivered in usual fashion. Infant with spontaneous cry, placed on mother's abdomen, dried and stimulated. Cord clamped x 2 after 1-minute delay, and cut by father. Cord blood drawn. Placenta delivered spontaneously with gentle cord traction. Fundus firm with massage and Pitocin. Labia, perineum, vagina, and cervix inspected with superficial perineal abrasion which was hemostatic.  Baby Weight: pending  Placenta: Sent to L&D Complications: None Lacerations: Superficial perineal, no repair needed EBL: 150 mL Analgesia: Not medicated   Infant:  APGAR (1 MIN): 9 APGAR (5 MINS): 9  [name], DO Family Medicine PGY-3  04/13/2020, 8:16 PM

## 2019-12-22 LAB — OB RESULTS CONSOLE HIV ANTIBODY (ROUTINE TESTING): HIV: NONREACTIVE

## 2019-12-22 LAB — OB RESULTS CONSOLE VARICELLA ZOSTER ANTIBODY, IGG: Varicella: IMMUNE

## 2019-12-22 LAB — OB RESULTS CONSOLE HGB/HCT, BLOOD
HCT: 36 (ref 29–41)
Hemoglobin: 11.7

## 2019-12-22 LAB — OB RESULTS CONSOLE ABO/RH: RH Type: POSITIVE

## 2019-12-22 LAB — CULTURE, OB URINE: Urine Culture, OB: NEGATIVE

## 2019-12-22 LAB — OB RESULTS CONSOLE HEPATITIS B SURFACE ANTIGEN: Hepatitis B Surface Ag: NEGATIVE

## 2019-12-22 LAB — OB RESULTS CONSOLE RUBELLA ANTIBODY, IGM: Rubella: IMMUNE

## 2019-12-22 LAB — OB RESULTS CONSOLE GC/CHLAMYDIA
Chlamydia: NEGATIVE
Gonorrhea: NEGATIVE

## 2019-12-22 LAB — GLUCOSE TOLERANCE, 1 HOUR: Glucose 1 Hour: 217

## 2019-12-22 LAB — OB RESULTS CONSOLE RPR: RPR: NONREACTIVE

## 2019-12-22 LAB — HEPATITIS C ANTIBODY: HCV Ab: NEGATIVE

## 2019-12-22 LAB — SICKLE CELL SCREEN: Sickle Cell Screen: NORMAL

## 2019-12-23 LAB — CYTOLOGY - PAP: Pap Smear: UNDETERMINED

## 2019-12-30 ENCOUNTER — Encounter: Payer: Self-pay | Admitting: Registered"

## 2019-12-31 ENCOUNTER — Encounter: Payer: Self-pay | Admitting: *Deleted

## 2020-01-01 ENCOUNTER — Encounter: Payer: Self-pay | Attending: Obstetrics & Gynecology | Admitting: Registered"

## 2020-01-01 ENCOUNTER — Ambulatory Visit: Payer: Self-pay | Admitting: Registered"

## 2020-01-01 ENCOUNTER — Other Ambulatory Visit: Payer: Self-pay

## 2020-01-01 DIAGNOSIS — O24419 Gestational diabetes mellitus in pregnancy, unspecified control: Secondary | ICD-10-CM | POA: Insufficient documentation

## 2020-01-01 DIAGNOSIS — O99810 Abnormal glucose complicating pregnancy: Secondary | ICD-10-CM

## 2020-01-01 DIAGNOSIS — Z3A00 Weeks of gestation of pregnancy not specified: Secondary | ICD-10-CM | POA: Insufficient documentation

## 2020-01-01 NOTE — Progress Notes (Signed)
Interpreter services provided by Verdis Frederickson [phone] from [place]; video froze continued with Cori Razor #[id number] from [place]; Ticio #[id number] from [place]  Patient was seen on 01/01/20 for Gestational Diabetes self-management. EDD 04/05/20. Patient states prior history of GDM. Diet history obtained. Patient eats variety of all food groups. Beverages include water.  Patient is likely consuming excess carbohydrates in the form of tortilla, rice, beans.   The following learning objectives were met by the patient :   States the definition of Gestational Diabetes  States why dietary management is important in controlling blood glucose  Describes the effects of carbohydrates on blood glucose levels  Demonstrates ability to create a balanced meal plan  Demonstrates carbohydrate counting   States when to check blood glucose levels  Demonstrates proper blood glucose monitoring techniques  States the effect of stress and exercise on blood glucose levels  States the importance of limiting caffeine and abstaining from alcohol and smoking  Plan:  Aim for 3 Carbohydrate Choices per meal (45 grams) +/- 1 either way  Aim for 1-2 Carbohydrate Choices per snack Begin reading food labels for Total Carbohydrate of foods If OK with your MD, consider  increasing your activity level by walking, Arm Chair Exercises or other activity daily as tolerated Begin checking Blood Glucose before breakfast and 2 hours after first bite of breakfast, lunch and dinner as directed by MD  Bring Log Book/Sheet and meter to every medical appointment  Baby Scripts: Patient not appropriate for Baby Scripts due to language barrier  Take medication if directed by MD  Blood glucose monitor given: [employer] # [id number] Blood glucose reading: 142 mg/dL  Patient instructed to monitor glucose levels: FBS: 60 - 95 mg/dl 2 hour: <120 mg/dl  Patient received the following handouts:  Nutrition Diabetes and Pregnancy  Carbohydrate Counting  List  Blood glucose Log Sheet  Patient will be seen for follow-up in 1 week.

## 2020-01-07 ENCOUNTER — Other Ambulatory Visit: Payer: Self-pay | Admitting: *Deleted

## 2020-01-07 ENCOUNTER — Other Ambulatory Visit: Payer: Self-pay

## 2020-01-07 DIAGNOSIS — O24419 Gestational diabetes mellitus in pregnancy, unspecified control: Secondary | ICD-10-CM

## 2020-01-08 ENCOUNTER — Ambulatory Visit: Payer: Self-pay | Admitting: Registered"

## 2020-01-08 ENCOUNTER — Other Ambulatory Visit: Payer: Self-pay

## 2020-01-08 ENCOUNTER — Encounter: Payer: Self-pay | Admitting: Registered"

## 2020-01-08 DIAGNOSIS — O24419 Gestational diabetes mellitus in pregnancy, unspecified control: Secondary | ICD-10-CM

## 2020-01-08 NOTE — Progress Notes (Signed)
Interpreter services provided by Ernestina Patches #[id number] from [facility]  Patient was seen on 01/08/20 for follow-up assessment and education for Gestational Diabetes. EDD 04/05/20. Patient states changes to diet/lifestyle including smaller portions of rice and not in same meal as tortilla.  Patient's blood sugar log shows most numbers are close to within range. Likely elevated numbers are due to fruit intake.  Patient is testing blood glucose as directed pre breakfast and 2 hours after each meal.  FBS: 54-100 mg/dL Post meal: 672-094 mg/dL 1 reading <70 mg/dL. Pt reports no symptoms. Blood sample may have been diluted. RD reviewed technique for checking blood sugar.  (The log appears it could have been filled in all on the same day, and where so many numbers are close to the same, there is a chance these are not reflecting accurate data. However, there is a lot of variability in FBS. Pt also reported seeing one number in the 200s and she rechecked and it was 130-something)    The following learning objectives reviewed during follow-up visit:   Effect of fruit on blood sugar and appropriate amounts to eat  Balanced eating  Plan:  . Continue to eat balanced meals. Your reported meals are at or below the recommended carbohydrate intake. No need to cut more carbs out of meals. . Eat 1/2 the amount of fruit you are currently eating and have protein with it or after a meal that is higher in protein. . If blood sugar is either much lower or much higher than expected, re-test. . Continue to exercise.  Patient instructed to monitor glucose levels: FBS: 60 - 95 mg/dl 2 hour: <962 mg/dl  Patient received the following handouts:  RD drew graph of estimated blood sugar changes during the day according to her log and reported intake.  Patient will be seen for follow-up in as needed.

## 2020-01-15 ENCOUNTER — Other Ambulatory Visit: Payer: Self-pay

## 2020-01-21 ENCOUNTER — Encounter: Payer: Medicaid Other | Admitting: Obstetrics and Gynecology

## 2020-01-28 ENCOUNTER — Ambulatory Visit (INDEPENDENT_AMBULATORY_CARE_PROVIDER_SITE_OTHER): Payer: Self-pay | Admitting: Family Medicine

## 2020-01-28 ENCOUNTER — Other Ambulatory Visit: Payer: Self-pay

## 2020-01-28 ENCOUNTER — Encounter: Payer: Self-pay | Admitting: Family Medicine

## 2020-01-28 VITALS — Ht 61.0 in

## 2020-01-28 DIAGNOSIS — R82998 Other abnormal findings in urine: Secondary | ICD-10-CM

## 2020-01-28 DIAGNOSIS — Z23 Encounter for immunization: Secondary | ICD-10-CM

## 2020-01-28 DIAGNOSIS — O09522 Supervision of elderly multigravida, second trimester: Secondary | ICD-10-CM

## 2020-01-28 DIAGNOSIS — O0992 Supervision of high risk pregnancy, unspecified, second trimester: Secondary | ICD-10-CM

## 2020-01-28 DIAGNOSIS — O24410 Gestational diabetes mellitus in pregnancy, diet controlled: Secondary | ICD-10-CM

## 2020-01-28 DIAGNOSIS — Z3A27 27 weeks gestation of pregnancy: Secondary | ICD-10-CM

## 2020-01-28 DIAGNOSIS — Z8759 Personal history of other complications of pregnancy, childbirth and the puerperium: Secondary | ICD-10-CM

## 2020-01-28 DIAGNOSIS — O0990 Supervision of high risk pregnancy, unspecified, unspecified trimester: Secondary | ICD-10-CM

## 2020-01-28 LAB — POCT URINALYSIS DIP (DEVICE)
Bilirubin Urine: NEGATIVE
Glucose, UA: 500 mg/dL — AB
Ketones, ur: NEGATIVE mg/dL
Nitrite: NEGATIVE
Protein, ur: NEGATIVE mg/dL
Specific Gravity, Urine: 1.015 (ref 1.005–1.030)
Urobilinogen, UA: 0.2 mg/dL (ref 0.0–1.0)
pH: 6 (ref 5.0–8.0)

## 2020-01-28 MED ORDER — ASPIRIN EC 81 MG PO TBEC: 81.0000 mg | DELAYED_RELEASE_TABLET | Freq: Every day | ORAL | 3 refills | Status: DC

## 2020-01-28 NOTE — Patient Instructions (Signed)
 Lactancia materna Breastfeeding  Decidir amamantar es una de las mejores elecciones que puede hacer por usted y su beb. Un cambio en las hormonas durante el embarazo hace que las mamas produzcan leche materna en las glndulas productoras de leche. Las hormonas impiden que la leche materna sea liberada antes del nacimiento del beb. Adems, impulsan el flujo de leche luego del nacimiento. Una vez que ha comenzado a amamantar, pensar en el beb, as como la succin o el llanto, pueden estimular la liberacin de leche de las glndulas productoras de leche. Los beneficios de amamantar Las investigaciones demuestran que la lactancia materna ofrece muchos beneficios de salud para bebs y madres. Adems, ofrece una forma gratuita y conveniente de alimentar al beb. Para el beb  La primera leche (calostro) ayuda a mejorar el funcionamiento del aparato digestivo del beb.  Las clulas especiales de la leche (anticuerpos) ayudan a combatir las infecciones en el beb.  Los bebs que se alimentan con leche materna tambin tienen menos probabilidades de tener asma, alergias, obesidad o diabetes de tipo 2. Adems, tienen menor riesgo de sufrir el sndrome de muerte sbita del lactante (SMSL).  Los nutrientes de la leche materna son mejores para satisfacer las necesidades del beb en comparacin con la leche maternizada.  La leche materna mejora el desarrollo cerebral del beb. Para usted  La lactancia materna favorece el desarrollo de un vnculo muy especial entre la madre y el beb.  Es conveniente. La leche materna es econmica y siempre est disponible a la temperatura correcta.  La lactancia materna ayuda a quemar caloras. Le ayuda a perder el peso ganado durante el embarazo.  Hace que el tero vuelva al tamao que tena antes del embarazo ms rpido. Adems, disminuye el sangrado (loquios) despus del parto.  La lactancia materna contribuye a reducir el riesgo de tener diabetes de tipo 2,  osteoporosis, artritis reumatoide, enfermedades cardiovasculares y cncer de mama, ovario, tero y endometrio en el futuro. Informacin bsica sobre la lactancia Comienzo de la lactancia  Encuentre un lugar cmodo para sentarse o acostarse, con un buen respaldo para el cuello y la espalda.  Coloque una almohada o una manta enrollada debajo del beb para acomodarlo a la altura de la mama (si est sentada). Las almohadas para amamantar se han diseado especialmente a fin de servir de apoyo para los brazos y el beb mientras amamanta.  Asegrese de que la barriga del beb (abdomen) est frente a la suya.  Masajee suavemente la mama. Con las yemas de los dedos, masajee los bordes exteriores de la mama hacia adentro, en direccin al pezn. Esto estimula el flujo de leche. Si la leche fluye lentamente, es posible que deba continuar con este movimiento durante la lactancia.  Sostenga la mama con 4 dedos por debajo y el pulgar por arriba del pezn (forme la letra "C" con la mano). Asegrese de que los dedos se encuentren lejos del pezn y de la boca del beb.  Empuje suavemente los labios del beb con el pezn o con el dedo.  Cuando la boca del beb se abra lo suficiente, acrquelo rpidamente a la mama e introduzca todo el pezn y la arola, tanto como sea posible, dentro de la boca del beb. La arola es la zona de color que rodea al pezn. ? Debe haber ms arola visible por arriba del labio superior del beb que por debajo del labio inferior. ? Los labios del beb deben estar abiertos y extendidos hacia afuera (evertidos) para asegurar   que el beb se prenda de forma adecuada y cmoda. ? La lengua del beb debe estar entre la enca inferior y la mama.  Asegrese de que la boca del beb est en la posicin correcta alrededor del pezn (prendido). Los labios del beb deben crear un sello sobre la mama y estar doblados hacia afuera (invertidos).  Es comn que el beb succione durante 2 a 3 minutos  para que comience el flujo de leche materna. Cmo debe prenderse Es muy importante que le ensee al beb cmo prenderse adecuadamente a la mama. Si el beb no se prende adecuadamente, puede causar dolor en los pezones, reducir la produccin de leche materna y hacer que el beb tenga un escaso aumento de peso. Adems, si el beb no se prende adecuadamente al pezn, puede tragar aire durante la alimentacin. Esto puede causarle molestias al beb. Hacer eructar al beb al cambiar de mama puede ayudarlo a liberar el aire. Sin embargo, ensearle al beb cmo prenderse a la mama adecuadamente es la mejor manera de evitar que se sienta molesto por tragar aire mientras se alimenta. Signos de que el beb se ha prendido adecuadamente al pezn  Tironea o succiona de modo silencioso, sin causarle dolor. Los labios del beb deben estar extendidos hacia afuera (evertidos).  Se escucha que traga cada 3 o 4 succiones una vez que la leche ha comenzado a fluir (despus de que se produzca el reflejo de eyeccin de la leche).  Hay movimientos musculares por arriba y por delante de sus odos al succionar. Signos de que el beb no se ha prendido adecuadamente al pezn  Hace ruidos de succin o de chasquido mientras se alimenta.  Siente dolor en los pezones. Si cree que el beb no se prendi correctamente, deslice el dedo en la comisura de la boca y colquelo entre las encas del beb para interrumpir la succin. Intente volver a comenzar a amamantar. Signos de lactancia materna exitosa Signos del beb  El beb disminuir gradualmente el nmero de succiones o dejar de succionar por completo.  El beb se quedar dormido.  El cuerpo del beb se relajar.  El beb retendr una pequea cantidad de leche en la boca.  El beb se desprender solo del pecho. Signos que presenta usted  Las mamas han aumentado la firmeza, el peso y el tamao 1 a 3 horas despus de amamantar.  Estn ms blandas inmediatamente despus  de amamantar.  Se producen un aumento del volumen de leche y un cambio en su consistencia y color hacia el quinto da de lactancia.  Los pezones no duelen, no estn agrietados ni sangran. Signos de que su beb recibe la cantidad de leche suficiente  Mojar por lo menos 1 o 2paales durante las primeras 24horas despus del nacimiento.  Mojar por lo menos 5 o 6paales cada 24horas durante la primera semana despus del nacimiento. La orina debe ser clara o de color amarillo plido a los 5das de vida.  Mojar entre 6 y 8paales cada 24horas a medida que el beb sigue creciendo y desarrollndose.  Defeca por lo menos 3 veces en 24 horas a los 5 das de vida. Las heces deben ser blandas y amarillentas.  Defeca por lo menos 3 veces en 24 horas a los 7 das de vida. Las heces deben ser grumosas y amarillentas.  No registra una prdida de peso mayor al 10% del peso al nacer durante los primeros 3 das de vida.  Aumenta de peso un promedio de 4   a 7onzas (113 a 198g) por semana despus de los 4 das de vida.  Aumenta de peso, diariamente, de manera uniforme a partir de los 5 das de vida, sin registrar prdida de peso despus de las 2semanas de vida. Despus de alimentarse, es posible que el beb regurgite una pequea cantidad de leche. Esto es normal. Frecuencia y duracin de la lactancia El amamantamiento frecuente la ayudar a producir ms leche y puede prevenir dolores en los pezones y las mamas extremadamente llenas (congestin mamaria). Alimente al beb cuando muestre signos de hambre o si siente la necesidad de reducir la congestin de las mamas. Esto se denomina "lactancia a demanda". Las seales de que el beb tiene hambre incluyen las siguientes:  Aumento del estado de alerta, actividad o inquietud.  Mueve la cabeza de un lado a otro.  Abre la boca cuando se le toca la mejilla o la comisura de la boca (reflejo de bsqueda).  Aumenta las vocalizaciones, tales como sonidos de  succin, se relame los labios, emite arrullos, suspiros o chirridos.  Mueve la mano hacia la boca y se chupa los dedos o las manos.  Est molesto o llora. Evite el uso del chupete en las primeras 4 a 6 semanas despus del nacimiento del beb. Despus de este perodo, podr usar un chupete. Las investigaciones demostraron que el uso del chupete durante el primer ao de vida del beb disminuye el riesgo de tener el sndrome de muerte sbita del lactante (SMSL). Permita que el nio se alimente en cada mama todo lo que desee. Cuando el beb se desprende o se queda dormido mientras se est alimentando de la primera mama, ofrzcale la segunda. Debido a que, con frecuencia, los recin nacidos estn somnolientos las primeras semanas de vida, es posible que deba despertar al beb para alimentarlo. Los horarios de lactancia varan de un beb a otro. Sin embargo, las siguientes reglas pueden servir como gua para ayudarla a garantizar que el beb se alimenta adecuadamente:  Se puede amamantar a los recin nacidos (bebs de 4 semanas o menos de vida) cada 1 a 3 horas.  No deben transcurrir ms de 3 horas durante el da o 5 horas durante la noche sin que se amamante a los recin nacidos.  Debe amamantar al beb un mnimo de 8 veces en un perodo de 24 horas. Extraccin de leche materna     La extraccin y el almacenamiento de la leche materna le permiten asegurarse de que el beb se alimente exclusivamente de su leche materna, aun en momentos en los que no puede amamantar. Esto tiene especial importancia si debe regresar al trabajo en el perodo en que an est amamantando o si no puede estar presente en los momentos en que el beb debe alimentarse. Su asesor en lactancia puede ayudarla a encontrar un mtodo de extraccin que funcione mejor para usted y orientarla sobre cunto tiempo es seguro almacenar leche materna. Cmo cuidar las mamas durante la lactancia Los pezones pueden secarse, agrietarse y doler  durante la lactancia. Las siguientes recomendaciones pueden ayudarla a mantener las mamas humectadas y sanas:  Evite usar jabn en los pezones.  Use un sostn de soporte diseado especialmente para la lactancia materna. Evite usar sostenes con aro o sostenes muy ajustados (sostenes deportivos).  Seque al aire sus pezones durante 3 a 4minutos despus de amamantar al beb.  Utilice solo apsitos de algodn en el sostn para absorber las prdidas de leche. La prdida de un poco de leche materna entre   las tomas es normal.  Utilice lanolina sobre los pezones luego de amamantar. La lanolina ayuda a mantener la humedad normal de la piel. La lanolina pura no es perjudicial (no es txica) para el beb. Adems, puede extraer manualmente algunas gotas de leche materna y masajear suavemente esa leche sobre los pezones para que la leche se seque al aire. Durante las primeras semanas despus del nacimiento, algunas mujeres experimentan congestin mamaria. La congestin mamaria puede hacer que sienta las mamas pesadas, calientes y sensibles al tacto. El pico de la congestin mamaria ocurre en el plazo de los 3 a 5 das despus del parto. Las siguientes recomendaciones pueden ayudarla a aliviar la congestin mamaria:  Vace por completo las mamas al amamantar o extraer leche. Puede aplicar calor hmedo en las mamas (en la ducha o con toallas hmedas para manos) antes de amamantar o extraer leche. Esto aumenta la circulacin y ayuda a que la leche fluya. Si el beb no vaca por completo las mamas cuando lo amamanta, extraiga la leche restante despus de que haya finalizado.  Aplique compresas de hielo sobre las mamas inmediatamente despus de amamantar o extraer leche, a menos que le resulte demasiado incmodo. Haga lo siguiente: ? Ponga el hielo en una bolsa plstica. ? Coloque una toalla entre la piel y la bolsa de hielo. ? Coloque el hielo durante 20minutos, 2 o 3veces por da.  Asegrese de que el beb  est prendido y se encuentre en la posicin correcta mientras lo alimenta. Si la congestin mamaria persiste luego de 48 horas o despus de seguir estas recomendaciones, comunquese con su mdico o un asesor en lactancia. Recomendaciones de salud general durante la lactancia  Consuma 3 comidas y 3 colaciones saludables todos los das. Las madres bien alimentadas que amamantan necesitan entre 450 y 500 caloras adicionales por da. Puede cumplir con este requisito al aumentar la cantidad de una dieta equilibrada que realice.  Beba suficiente agua para mantener la orina clara o de color amarillo plido.  Descanse con frecuencia, reljese y siga tomando sus vitaminas prenatales para prevenir la fatiga, el estrs y los niveles bajos de vitaminas y minerales en el cuerpo (deficiencias de nutrientes).  No consuma ningn producto que contenga nicotina o tabaco, como cigarrillos y cigarrillos electrnicos. El beb puede verse afectado por las sustancias qumicas de los cigarrillos que pasan a la leche materna y por la exposicin al humo ambiental del tabaco. Si necesita ayuda para dejar de fumar, consulte al mdico.  Evite el consumo de alcohol.  No consuma drogas ilegales o marihuana.  Antes de usar cualquier medicamento, hable con el mdico. Estos incluyen medicamentos recetados y de venta libre, como tambin vitaminas y suplementos a base de hierbas. Algunos medicamentos, que pueden ser perjudiciales para el beb, pueden pasar a travs de la leche materna.  Puede quedar embarazada durante la lactancia. Si se desea un mtodo anticonceptivo, consulte al mdico sobre cules son las opciones seguras durante la lactancia. Dnde encontrar ms informacin: Liga internacional La Leche: www.llli.org. Comunquese con un mdico si:  Siente que quiere dejar de amamantar o se siente frustrada con la lactancia.  Sus pezones estn agrietados o sangran.  Sus mamas estn irritadas, sensibles o  calientes.  Tiene los siguientes sntomas: ? Dolor en las mamas o en los pezones. ? Un rea hinchada en cualquiera de las mamas. ? Fiebre o escalofros. ? Nuseas o vmitos. ? Drenaje de otro lquido distinto de la leche materna desde los pezones.  Sus mamas no   se llenan antes de amamantar al beb para el quinto da despus del parto.  Se siente triste y deprimida.  El beb: ? Est demasiado somnoliento como para comer bien. ? Tiene problemas para dormir. ? Tiene ms de 1 semana de vida y moja menos de 6 paales en un periodo de 24 horas. ? No ha aumentado de peso a los 5 das de vida.  El beb defeca menos de 3 veces en 24 horas.  La piel del beb o las partes blancas de los ojos se vuelven amarillentas. Solicite ayuda de inmediato si:  El beb est muy cansado (letargo) y no se quiere despertar para comer.  Le sube la fiebre sin causa. Resumen  La lactancia materna ofrece muchos beneficios de salud para bebs y madres.  Intente amamantar a su beb cuando muestre signos tempranos de hambre.  Haga cosquillas o empuje suavemente los labios del beb con el dedo o el pezn para lograr que el beb abra la boca. Acerque el beb a la mama. Asegrese de que la mayor parte de la arola se encuentre dentro de la boca del beb. Ofrzcale una mama y haga eructar al beb antes de pasar a la otra.  Hable con su mdico o asesor en lactancia si tiene dudas o problemas con la lactancia. Esta informacin no tiene como fin reemplazar el consejo del mdico. Asegrese de hacerle al mdico cualquier pregunta que tenga. Document Revised: 10/11/2017 Document Reviewed: 11/06/2016 Elsevier Patient Education  2020 [employer]vier [employer].  

## 2020-01-28 NOTE — Addendum Note (Signed)
Addended by: Maxwell Marion E on: 01/28/2020 01:55 PM   Modules accepted: Orders

## 2020-01-28 NOTE — Progress Notes (Signed)
   PRENATAL VISIT NOTE  Subjective:  Keviana Guida is a 34 y.o. G4P3003 at [redacted]w[redacted]d being seen today for transferring prenatal care from [facility] in [place].  She is currently monitored for the following issues for this high-risk pregnancy and has Gestational diabetes mellitus (GDM), antepartum; AMA (advanced maternal age) multigravida 35+; Supervision of high risk pregnancy, antepartum; and History of shoulder dystocia in prior pregnancy on their problem list.  Patient reports no complaints.  Contractions: Not present. Vag. Bleeding: None.  Movement: Present. Denies leaking of fluid.   The following portions of the patient's history were reviewed and updated as appropriate: allergies, current medications, past family history, past medical history, past social history, past surgical history and problem list.   Objective:   Vitals:   01/28/20 0925  Height: 5\' 1"  (1.549 m)    Fetal Status: Fetal Heart Rate (bpm): 140 Fundal Height: 27 cm Movement: Present     General:  Alert, oriented and cooperative. Patient is in no acute distress.  Skin: Skin is warm and dry. No rash noted.   Cardiovascular: Normal heart rate noted  Respiratory: Normal respiratory effort, no problems with respiration noted  Abdomen: Soft, gravid, appropriate for gestational age.  Pain/Pressure: Present     Pelvic: Cervical exam deferred        Extremities: Normal range of motion.  Edema: None  Mental Status: Normal mood and affect. Normal behavior. Normal judgment and thought content.   Assessment and Plan:  Pregnancy: G4P3003 at [redacted]w[redacted]d 1. Supervision of high risk pregnancy, antepartum Needs anatomy u/s - [redacted]w[redacted]d MFM OB DETAIL +14 WK; Future - CBC - HIV Antibody (routine testing w rflx) - RPR  2. Diet controlled gestational diabetes mellitus (GDM), antepartum Baseline labs ASA Fetal echo FBG 70-90 ish no book 2 hour pp 130 ish--bring book and meter to all future visits - Hemoglobin A1c - TSH - Protein /  creatinine ratio, urine - Comprehensive metabolic panel - Ambulatory referral to Pediatric Cardiology - aspirin EC 81 MG tablet; Take 1 tablet (81 mg total) by mouth daily.  Dispense: 90 tablet; Refill: 3  3. History of shoulder dystocia in prior pregnancy Discussed risks, offered primary C-section if baby is similar size--diet, + exercise, excellent glycemic control.  4. Multigravida of advanced maternal age in second trimester Declines genetics  Preterm labor symptoms and general obstetric precautions including but not limited to vaginal bleeding, contractions, leaking of fluid and fetal movement were reviewed in detail with the patient. Please refer to After Visit Summary for other counseling recommendations.   Return in 2 weeks (on 02/11/2020).  Future Appointments  Date Time Provider Department Center  02/11/2020  2:35 PM 02/13/2020, MD [facility]    [facility] MD

## 2020-01-29 LAB — CBC
Hematocrit: 37.4 % (ref 34.0–46.6)
Hemoglobin: 12.4 g/dL (ref 11.1–15.9)
MCH: 31.3 pg (ref 26.6–33.0)
MCHC: 33.2 g/dL (ref 31.5–35.7)
MCV: 94 fL (ref 79–97)
Platelets: 271 10*3/uL (ref 150–450)
RBC: 3.96 x10E6/uL (ref 3.77–5.28)
RDW: 12.4 % (ref 11.7–15.4)
WBC: 6.2 10*3/uL (ref 3.4–10.8)

## 2020-01-29 LAB — COMPREHENSIVE METABOLIC PANEL
ALT: 11 IU/L (ref 0–32)
AST: 9 IU/L (ref 0–40)
Albumin/Globulin Ratio: 1.7 (ref 1.2–2.2)
Albumin: 3.8 g/dL (ref 3.8–4.8)
Alkaline Phosphatase: 96 IU/L (ref 48–121)
BUN/Creatinine Ratio: 15 (ref 9–23)
BUN: 7 mg/dL (ref 6–20)
Bilirubin Total: <0.2 mg/dL (ref 0.0–1.2)
CO2: 18 mmol/L — ABNORMAL LOW (ref 20–29)
Calcium: 9.1 mg/dL (ref 8.7–10.2)
Chloride: 102 mmol/L (ref 96–106)
Creatinine, Ser: 0.48 mg/dL — ABNORMAL LOW (ref 0.57–1.00)
GFR calc Af Amer: 146 mL/min/{1.73_m2} (ref >59)
GFR calc non Af Amer: 127 mL/min/{1.73_m2} (ref >59)
Globulin, Total: 2.2 g/dL (ref 1.5–4.5)
Glucose: 131 mg/dL — ABNORMAL HIGH (ref 65–99)
Potassium: 4 mmol/L (ref 3.5–5.2)
Sodium: 135 mmol/L (ref 134–144)
Total Protein: 6 g/dL (ref 6.0–8.5)

## 2020-01-29 LAB — PROTEIN / CREATININE RATIO, URINE
Creatinine, Urine: 30.9 mg/dL
Protein, Ur: 8.2 mg/dL
Protein/Creat Ratio: 265 mg/g creat — ABNORMAL HIGH (ref 0–200)

## 2020-01-29 LAB — HIV ANTIBODY (ROUTINE TESTING W REFLEX): HIV Screen 4th Generation wRfx: NONREACTIVE

## 2020-01-29 LAB — HEMOGLOBIN A1C
Est. average glucose Bld gHb Est-mCnc: 140 mg/dL
Hgb A1c MFr Bld: 6.5 % — ABNORMAL HIGH (ref 4.8–5.6)

## 2020-01-29 LAB — RPR: RPR Ser Ql: NONREACTIVE

## 2020-01-29 LAB — TSH: TSH: 2.84 u[IU]/mL (ref 0.450–4.500)

## 2020-02-05 DIAGNOSIS — R87810 Cervical high risk human papillomavirus (HPV) DNA test positive: Secondary | ICD-10-CM

## 2020-02-05 DIAGNOSIS — O0990 Supervision of high risk pregnancy, unspecified, unspecified trimester: Secondary | ICD-10-CM

## 2020-02-11 ENCOUNTER — Other Ambulatory Visit: Payer: Self-pay

## 2020-02-11 ENCOUNTER — Ambulatory Visit (INDEPENDENT_AMBULATORY_CARE_PROVIDER_SITE_OTHER): Payer: Self-pay | Admitting: Obstetrics and Gynecology

## 2020-02-11 VITALS — BP 116/74 | HR 84 | Wt 160.5 lb

## 2020-02-11 DIAGNOSIS — Z3A29 29 weeks gestation of pregnancy: Secondary | ICD-10-CM

## 2020-02-11 DIAGNOSIS — Z8759 Personal history of other complications of pregnancy, childbirth and the puerperium: Secondary | ICD-10-CM

## 2020-02-11 DIAGNOSIS — R87610 Atypical squamous cells of undetermined significance on cytologic smear of cervix (ASC-US): Secondary | ICD-10-CM

## 2020-02-11 DIAGNOSIS — O09523 Supervision of elderly multigravida, third trimester: Secondary | ICD-10-CM

## 2020-02-11 DIAGNOSIS — O0990 Supervision of high risk pregnancy, unspecified, unspecified trimester: Secondary | ICD-10-CM

## 2020-02-11 DIAGNOSIS — O24410 Gestational diabetes mellitus in pregnancy, diet controlled: Secondary | ICD-10-CM

## 2020-02-11 DIAGNOSIS — R87810 Cervical high risk human papillomavirus (HPV) DNA test positive: Secondary | ICD-10-CM

## 2020-02-11 MED ORDER — ACCU-CHEK SOFT TOUCH LANCETS MISC: 12 refills | Status: DC

## 2020-02-11 NOTE — Patient Instructions (Signed)
Tercer trimestre de Media planner Third Trimester of Pregnancy  El tercer trimestre comprende desde la [employer] la [id number] (desde el mes7 hasta el mes9). En este trimestre, el beb en gestacin (feto) crece muy rpidamente. Hacia el final del noveno mes, el beb en gestacin mide alrededor de 20pulgadas (45cm) de largo. Pesa entre 6y 10libras [phone]). Siga estas indicaciones en su casa: Medicamentos  [employer] de venta libre y los recetados solamente como se lo haya indicado el mdico. Algunos medicamentos son seguros para tomar durante el Media planner y otros no lo son.  Tome vitaminas prenatales que contengan por lo menos [id number] (?g) de cido flico.  Si tiene dificultad para mover el intestino (estreimiento), tome un medicamento para ablandar las heces (laxante) si su mdico se lo autoriza. Comida y bebida   Ingiera alimentos saludables de [place] regular.  No coma carne cruda ni quesos sin cocinar.  Si obtiene poca cantidad de calcio de los alimentos que ingiere, consulte a su mdico sobre la posibilidad de tomar un suplemento diario de calcio.  La ingesta diaria de cuatro o cinco comidas pequeas en lugar de tres comidas abundantes.  Evite el consumo de alimentos ricos en grasas y azcares, como los alimentos fritos y los dulces.  Para evitar el estreimiento: ? Consuma alimentos ricos en fibra, como frutas y verduras frescas, cereales integrales y frijoles. ? Beba suficiente lquido para mantener el pis (orina) claro o de color amarillo plido. Actividad  Haga ejercicios solamente como se lo haya indicado el mdico. Interrumpa la actividad fsica si comienza a tener calambres.  No levante objetos pesados, use zapatos de tacones bajos y sintese derecha.  No haga ejercicio si hace demasiado calor, hay demasiada humedad o se encuentra en un lugar de mucha altura (altitud alta).  Puede continuar teniendo [employer], a menos que el  mdico le indique lo contrario. Alivio del dolor y del Tree surgeon  Use un sostn que le brinde buen soporte si sus mamas estn sensibles.  Haga pausas frecuentes y descanse con las piernas levantadas si tiene calambres en las piernas o dolor en la zona lumbar.  Dese baos de asiento con agua tibia para Best boy o las molestias causadas por las hemorroides. Use una crema para las hemorroides si el mdico la autoriza.  Si desarrolla venas hinchadas y abultadas (vrices) en las piernas: ? Use medias de compresin o medias de descanso como se lo haya indicado el mdico. ? Levante (eleve) los pies durante 103mnutos, 3 o 4veces por dTraining and development officer ? Limite el consumo de sal en sus alimentos. Seguridad  CMetLifecinturn de seguridad cuando conduzca.  Haga una lista de los nmeros de telfono de eFreight forwarder que iBJ'snmeros de telfono de familiares, amigos, eSt. Georgehospital, as como los departamentos de polica y bomberos. Preparacin para la llegada del beb Para prepararse para la llegada de su beb:  Tome clases prenatales.  Practique ir mGuardian Life Insuranceal hospital.  VPalo Verde Hospitaly recorra el rea de maternidad.  Hable en su trabajo acerca de tomar licencia cuando llegue el beb.  Prepare el bolso que llevar al hospital.  Prepare la habitacin del beb.  Concurra a los controles mdicos.  Compre un asiento de seguridad oAutoNationatrs para llevar al beb en el automvil. Aprenda cmo instalarlo en el auto. Instrucciones generales  No se d baos de inmersin en agua caliente, baos turcos ni saunas.  No consuma ningn producto que contenga nicotina o tabaco, como cigarrillos y cigarrillos  electrnicos. Si necesita ayuda para dejar de fumar, consulte al mdico.  No beba alcohol.  No se haga duchas vaginales ni use tampones o toallas higinicas perfumadas.  No mantenga las piernas cruzadas durante mucho tiempo.  No haga viajes de larga distancia, excepto si es  obligatorio. Hgalos solamente si su mdico la autoriza.  Visite a su dentista si no lo ha Quarry manager. Use un cepillo de cerdas suaves para cepillarse los dientes. Psese el hilo dental con suavidad.  Evite el contacto con las bandejas sanitarias de los gatos y la tierra que estos animales usan. Estos elementos contienen bacterias que pueden causar defectos congnitos al beb y la posible prdida del beb (aborto espontneo) o la muerte fetal.  Concurra a todas las visitas prenatales como se lo haya indicado el mdico. Esto es importante. Comunquese con un mdico si:  No est segura de si est en trabajo de parto o si ha roto la bolsa de las aguas.  Tiene mareos.  Tiene clicos leves o siente presin en la parte baja del vientre.  Sufre un dolor persistente en el abdomen.  Sigue teniendo [employer], vomita o tiene heces lquidas.  Advierte un lquido con olor ftido que proviene de la vagina.  Siente dolor al [employer]. Solicite ayuda de inmediato si:  Tiene fiebre.  Tiene una prdida de lquido por la vagina.  Tiene sangrado o pequeas prdidas vaginales.  Siente dolor intenso o clicos en el abdomen.  Aumenta o baja de peso rpidamente.  Tiene dificultades para recuperar el aliento y siente dolor en el pecho.  Sbitamente se le hinchan mucho el rostro, las [place], los tobillos, los pies o las piernas.  No ha sentido los movimientos del beb durante Leone Brand.  Siente un dolor de cabeza intenso que no se alivia con medicamentos.  Tiene dificultad para ver.  Tiene prdida de lquido o Chief Financial Officer chorro de lquido de la vagina antes de estar en la semana 37.  Tiene espasmos abdominales (contracciones) regulares antes de estar en la semana 22. Resumen  El tercer trimestre comprende desde la [employer] la [id number] (desde el mes7 hasta el mes9). Esta es la poca en que el beb en gestacin crece muy rpidamente.  Siga los consejos del mdico  con respecto a los medicamentos, la alimentacin y [place].  Preprese para la llegada del beb tomando las clases prenatales, preparando todo lo que necesitar el beb, arreglando la habitacin del beb y concurriendo a los controles mdicos.  Solicite ayuda de inmediato si tiene sangrado por la vagina, siente dolor en el pecho o tiene dificultad para respirar, o si no ha sentido que su beb se mueve en el transcurso de ms de [employer]. Esta informacin no tiene Marine scientist el consejo del mdico. Asegrese de hacerle al mdico cualquier pregunta que tenga. Document Revised: 02/19/2017 Document Reviewed: 02/19/2017 Elsevier Patient Education  [place]. Diabetes mellitus gestacional, cuidados personales Gestational Diabetes Mellitus, Self Care Las mujeres que tienen diabetes gestacional (diabetes mellitus gestacional) deben mantener su nivel de azcar en la sangre (glucosa) dentro de un rango saludable. Es posible hacerlo por medio de lo siguiente:  Alimentacin.  Actividad fsica.  Cambios en el estilo de vida.  Medicamentos o insulina, si es necesario.  [employer] mdicos y de Producer, television/film/video. Si recibe tratamiento para esta afeccin, es posible que ni usted ni su beb en gestacin (feto) se vean afectados. Si no recibe tratamiento, esta afeccin puede causar  problemas que pueden ser perjudiciales para usted o su beb en gestacin. Si tiene diabetes gestacional:  Es ms probable que vuelva a tenerla si queda embarazada nuevamente.  Es ms probable que desarrolle diabetes tipo2 en el futuro. Cmo mantenerse informada sobre Retail buyer de azcar en la sangre   Controle su nivel de azcar en la sangre todos los das durante el [place]. Haga los controles con la frecuencia que le hayan indicado.  Llame al mdico si el nivel de azcar en la sangre est por encima de las cifras ideales en dosanlisis seguidos. El mdico fijar objetivos de tratamiento personales  para usted. Generalmente, los [employer] Corporation niveles de Location manager en la sangre deben ser los siguientes:  Antes de las comidas, o despus de no haber comido durante un tiempo prolongado (en ayunas o preprandial): igual o menor que 95 mg/dl (5,3 mmol/l).  Despus de las comidas (posprandial): ? Una hora despus de una comida: igual o menor que 127m/dl (7,870ml/l). ? Dos horas despus de una comida: igual o menor que 12029ml (6,7mm70ml).  Nivel de A1c (hemoglobinaA1c): del 6% al 6,5%. Cmo controlar los niveles altos y bajos de azcaLocation managerla sangre Signos de un nivel alto de azcar en la sangre Un nivel alto de azcar en la sangre se denomina hiperglucemia. Conozca cules son los signos de un nivel alto de azcaDispensing opticians signos pueden incluir lo siguiente:  Sentir: ? Sed. ? Hambre. ? Mucho cansancio.  Necesidad de orinGarment/textile technologist mayor frecuencia que lo habitual.  Visin borrosa. Signos de un nivel bajo de azcar en la sangre Un nivel bajo de azcar en la sangre se denomina hipoglucemia. Este cuadro ocurre cuando el nivel de azcar en la sangre es igual o menor que 70mg57m(3,9mmol6m. Los signos pueden incluir lo siguiente:  Sentir: ? Hambre. ? Preocupacin o nervios (ansiedad). ? Sudoracin y piel hIntel Corporationnfusin. ? Mareos. ? Somnolencia. ? Ganas de vomitar (nuseas).  Tener: ? Latidos cardacos acelerados. ? Dolor de cabezaNetherlandsmbios en la visin. ? Hormigueo y falta de sensibilidad (entumecimiento) alrededor de la boca, labios o lengua. ? Movimientos espasmdicos que no puede controlar (convulsiones).  Dificultades para hacer lo siguiente: ? Moverse (coordinacin). ? Dormir. ? Desmayos. ? Molestarse con facilidad (irritabilidad). Tratamiento del nivel bajo de azcar en la sangre Para tratar un nivel bajo de azcar en la sangre, ingiera un alimento o una bebida azucarada de inmediato. Si puede pensar con claridad y tragar de manera segura, siga la  regla 15/15, que consiste en lo siguiente:  Consuma 15gramos de un hidrato de carbono de accin rpida (carbohidrato). Hable con su mdico acerca de cunto debera consumir.  Algunos hidratos de carbono de accin rpida son: ? Comprimidos de azcar (pastillas de glucosa). Consuma 3o 4pastillas de glucosa. ? De 6 a 8unidades de caramelos duros. ? De 4 a 6onzas (de 120 a 150ml) 49mugo de frutas. ? De 4 a 6onzas (de 120 a 150ml) d41mfresco comn (no diettico). ? 1 cucharada (15ml) de33ml o azcar.  Contrlese el nivel de azcar en la sangre 15minutos43mpus de ingerir el hidrato de carbono.  Si el nivel de azcar en la sangre todava es igual o menor que 70mg/dl (321mol/l), i72mera nuevamente 15gramos de un hidrato de carbono.  Si el nivel de azcar en la sangre no supera los 70mg/dl (3,966m/l) desp55mde 3intentos, solicite ayuda de inmediato.  Ingiera una comida o una colacin en el transcurso de 1hora despus  de que el nivel de azcar en la sangre se haya normalizado. Tratamiento del nivel muy bajo de azcar en la sangre Si el nivel de azcar en la sangre es igual o menor que 66m/dl (388ml/l), significa que est muy bajo (hipoglucemia grave). Esto es unEngineer, maintenance (IT)No espere a ver si los sntomas desaparecen. Solicite atencin mdica de inmediato. Comunquese con el servicio de emergencias de su localidad (911 en los [place]). Si su nivel de azcar en la sangre es muy bajo y no puede ingerir ningn alimento ni bebida, tal vez deba aplicarse una inyeccin de glucagn. Un familiar o un amigo deben aprender a controlarle el azcar en la sangre y a aplicarle una inyeccin de glucagn. Pregntele al mdico si debe tener un kit de inyecciones de glucagn en su casa. Siga estas indicaciones en su casa: Medicamentos  Aplquese la insulina y tome los medicamentos para la diabetes como se lo hayan indicado.  Si el mdico le indica que se aplique ms o menos  insulina, o que tome ms o menos medicamentos, haga exactamente lo que leLandAmerica Financial No se quede sin insulina o sin medicamentos. Alimentos   Opte por opciones de alimentos saludables. Estos incluyen los siguientes: ? Pollo, pescado, claras de huevos y frijoles. ? Avena, harina integral, trigo burgol, arroz integral, quinua y mijo. ? FrLambert Mody verduras frescas. ? Productos lcteos descremados. ? Frutos secos, aguacate, aceite de oliva y aceite de canola.  Consulte a un especialista en alimentacin (nutricionista). Este profesional puede ayudarla a elPaediatric nursen plan de alimentacin adecuado para usted.  Siga las indicaciones del mdico respecto de lo que no puede comer o beber.  Beba suficiente lquido para maContractoris (la orina) de color amarillo plido.  Ingiera refrigerios saludables entre comidas nutritivas.  Lleve un registro de los hidratos de carbono que consume. Para hacerlo, lea las etiquetas de informacin nutricional y aprenda cules son los tamaos de las porciones de los alimentos.  Siga su plan para los das de enfermedad cuando no pueda comer ni beber normalmente. ElUnited Autolan con el mdico, de modo que est listo para usarlo. Actividad  Haga actividad fsica durante 3029mtos o ms por da, o durante el tiempo que el mdiViacomcomiende.  Hable con el mdico antes de comenzar una rutina de ejercicio o actividad nueva. Es posible que el mdico le indique que haga cambios en: ? La cantidad de insDover Corporation aplica o los medicamentos que toma. ? Cunto debe comer. Estilo de vida  No beba alcohol.  No use productos que contengan tabaco. Estos incluyen cigarrillos, tabaco para mascar y cigPsychologist, sport and exercisei necesita ayuda para dejar de fumar, consulte al mdico.  Aprenda cmo sobrellevar el estrs. Si necesita ayuda para lograrlo, consulte al mdiMeadWestvacouidado del cuerpo  Mantngase al da con las vacunas (inmunizaciones).  Cepllese los dientes y lasSelfridgesese hilo dental una o ms veces por da.  Vaya al dentista una vez cada 6me85m o con ms frecuencia.  Mantenga un peso saluTax adviserdicaciones generales  TomeDelphiventa libre y los recetados solamente como se lo haya indicado el mdico.  Pregntele al mdico sobre los riesgos de la presin arterial alta en el embarazo (preeclampsia y eclampsia).  Comparta su plan de atencin de la diabetes con: ? Sus compaeros de trabajo o de la escuela. ? Las [employer] las que convAnadarkogase pruebas de orina para deteProduct manager  presencia de cetonas: ? Cuando est enferma. ? Como se lo haya indicado el mdico.  Lleve consigo una tarjeta, o use un brazalete o una medalla que indique que tiene diabetes.  Concurra a todas las visitas de control como se lo haya indicado el mdico. Esto es importante. Cuidados despus del parto  Hgase controlar el nivel de azcar en la sangre 4 a 12semanas despus del parto.  Hgase controlar si tiene diabetes una o ms veces en el plazo de 3 aos. Preguntas para hacerle al mdico  Es necesario que me rena con Radio broadcast assistant en el cuidado de la diabetes?  Dnde puedo encontrar un grupo de 65 para mujeres con diabetes gestacional? Dnde buscar ms informacin Para obtener ms informacin sobre la diabetes, visite los siguientes sitios web:  American Diabetes Association (Asociacin Estadounidense de la Diabetes): www.diabetes.org  Centers for Disease Control and Prevention Librarian, academic) (Centros para Building surveyor y la Prevencin de Arboriculturist): http://www.wolf.info/ Resumen  Controle su nivel de azcar en la sangre (glucosa) [place]. Haga los controles con la frecuencia que le hayan indicado.  Aplquese la insulina y tome los medicamentos para la diabetes como se lo hayan indicado.  Concurra a todas las visitas de control como se lo haya indicado el mdico. Esto es  importante.  Hgase controlar el nivel de azcar en la sangre 4 a 12semanas despus del parto. Esta informacin no tiene Marine scientist el consejo del mdico. Asegrese de hacerle al mdico cualquier pregunta que tenga. Document Revised: 02/28/2018 Document Reviewed: 02/28/2018 Elsevier Patient Education  [place].

## 2020-02-11 NOTE — Progress Notes (Deleted)
.  rout 

## 2020-02-11 NOTE — Progress Notes (Signed)
° °  PRENATAL VISIT NOTE  Subjective:  Kathleen Mcgee is a 34 y.o. G4P3003 at [redacted]w[redacted]d being seen today for ongoing prenatal care.  She is currently monitored for the following issues for this high-risk pregnancy and has Diet controlled gestational diabetes mellitus (GDM) in third trimester; AMA (advanced maternal age) multigravida 35+; Supervision of high risk pregnancy, antepartum; History of shoulder dystocia in prior pregnancy; and ASCUS with positive high risk HPV cervical on their problem list.  Patient doing well with no acute concerns today. She reports no complaints.  Contractions: Not present. Vag. Bleeding: None.  Movement: Present. Denies leaking of fluid.   Pt again did not bring in her blood sugars.  She states her FBS was 85-90.  PPBS were 118-125 per pt.  Emphasized that we need the blood sugars to establish patterns to guide treatment.  Uncontrolled blood sugars can lead to macrosomia, dystocia or operative delivery.  The following portions of the patient's history were reviewed and updated as appropriate: allergies, current medications, past family history, past medical history, past social history, past surgical history and problem list. Problem list updated.  Objective:   Vitals:   02/11/20 1519  BP: 116/74  Pulse: 84  Weight: 160 lb 8 oz (72.8 kg)    Fetal Status: Fetal Heart Rate (bpm): 140   Movement: Present     General:  Alert, oriented and cooperative. Patient is in no acute distress.  Skin: Skin is warm and dry. No rash noted.   Cardiovascular: Normal heart rate noted  Respiratory: Normal respiratory effort, no problems with respiration noted  Abdomen: Soft, gravid, appropriate for gestational age.  Pain/Pressure: Present     Pelvic: Cervical exam deferred        Extremities: Normal range of motion.  Edema: None  Mental Status:  Normal mood and affect. Normal behavior. Normal judgment and thought content.   Assessment and Plan:  Pregnancy: G4P3003  at [redacted]w[redacted]d  1. History of shoulder dystocia in prior pregnancy   2. Supervision of high risk pregnancy, antepartum Growth scan was not completed, now has been reordered.  3. Multigravida of advanced maternal age in third trimester   4. Diet controlled gestational diabetes mellitus (GDM) in third trimester Glucose sheets have been given , pt advised to bring in completed sheets next visit. - US Fetal Echocardiography; Future - Lancets (ACCU-CHEK SOFT TOUCH) lancets; Use as instructed  Dispense: 100 each; Refill: 12  Preterm labor symptoms and general obstetric precautions including but not limited to vaginal bleeding, contractions, leaking of fluid and fetal movement were reviewed in detail with the patient.  Please refer to After Visit Summary for other counseling recommendations.   Return in about 2 weeks (around 02/25/2020) for [facility], in person.   Mariel Aloe, MD

## 2020-02-18 ENCOUNTER — Ambulatory Visit: Payer: Self-pay

## 2020-02-19 ENCOUNTER — Ambulatory Visit: Payer: Self-pay | Admitting: *Deleted

## 2020-02-19 ENCOUNTER — Other Ambulatory Visit: Payer: Self-pay | Admitting: *Deleted

## 2020-02-19 ENCOUNTER — Other Ambulatory Visit: Payer: Self-pay

## 2020-02-19 ENCOUNTER — Ambulatory Visit: Payer: Self-pay | Attending: Obstetrics and Gynecology

## 2020-02-19 DIAGNOSIS — O09529 Supervision of elderly multigravida, unspecified trimester: Secondary | ICD-10-CM

## 2020-02-19 DIAGNOSIS — O24410 Gestational diabetes mellitus in pregnancy, diet controlled: Secondary | ICD-10-CM

## 2020-02-19 DIAGNOSIS — Z3A30 30 weeks gestation of pregnancy: Secondary | ICD-10-CM

## 2020-02-19 DIAGNOSIS — Z363 Encounter for antenatal screening for malformations: Secondary | ICD-10-CM

## 2020-02-19 DIAGNOSIS — O09523 Supervision of elderly multigravida, third trimester: Secondary | ICD-10-CM

## 2020-02-19 DIAGNOSIS — O0990 Supervision of high risk pregnancy, unspecified, unspecified trimester: Secondary | ICD-10-CM

## 2020-02-20 ENCOUNTER — Other Ambulatory Visit: Payer: Self-pay | Admitting: *Deleted

## 2020-02-20 ENCOUNTER — Telehealth: Payer: Self-pay | Admitting: Lactation Services

## 2020-02-20 ENCOUNTER — Other Ambulatory Visit: Payer: Self-pay | Admitting: Obstetrics & Gynecology

## 2020-02-20 DIAGNOSIS — O2343 Unspecified infection of urinary tract in pregnancy, third trimester: Secondary | ICD-10-CM

## 2020-02-20 DIAGNOSIS — O09529 Supervision of elderly multigravida, unspecified trimester: Secondary | ICD-10-CM

## 2020-02-20 LAB — URINE CULTURE, OB REFLEX

## 2020-02-20 LAB — SPECIMEN STATUS REPORT

## 2020-02-20 LAB — CULTURE, OB URINE

## 2020-02-20 MED ORDER — NITROFURANTOIN MONOHYD MACRO 100 MG PO CAPS: 100.0000 mg | ORAL_CAPSULE | Freq: Two times a day (BID) | ORAL | 1 refills | Status: DC

## 2020-02-20 NOTE — Telephone Encounter (Signed)
-----   Message from Tereso Newcomer, MD sent at 02/20/2020 12:17 PM EDT ----- Macrobid prescribed.  Please call to inform patient of results and advise her to pick up prescription and take as directed.  Spanish speaking.

## 2020-02-20 NOTE — Telephone Encounter (Signed)
Called patient with assistance of [facility] Spanish Telephone Interpreter # [phone].   Was not able to reach patient, LM for patient to call the office.

## 2020-02-23 NOTE — Telephone Encounter (Signed)
Called pt w/[employer] interpreter [phone] and left message on her personal voicemail. Pt was informed of test result which was positive for bladder infection. A prescription has been sent to her pharmacy. Please obtain the medication and take one tablet twice daily for one week.

## 2020-02-27 ENCOUNTER — Encounter: Payer: Self-pay | Admitting: Obstetrics and Gynecology

## 2020-03-18 ENCOUNTER — Other Ambulatory Visit: Payer: Self-pay

## 2020-03-18 ENCOUNTER — Ambulatory Visit: Payer: Self-pay | Attending: Obstetrics

## 2020-03-18 ENCOUNTER — Ambulatory Visit: Payer: Self-pay | Admitting: *Deleted

## 2020-03-18 DIAGNOSIS — O0990 Supervision of high risk pregnancy, unspecified, unspecified trimester: Secondary | ICD-10-CM | POA: Insufficient documentation

## 2020-03-18 DIAGNOSIS — O09523 Supervision of elderly multigravida, third trimester: Secondary | ICD-10-CM

## 2020-03-18 DIAGNOSIS — Z3A34 34 weeks gestation of pregnancy: Secondary | ICD-10-CM

## 2020-03-18 DIAGNOSIS — O24410 Gestational diabetes mellitus in pregnancy, diet controlled: Secondary | ICD-10-CM

## 2020-03-18 DIAGNOSIS — O09529 Supervision of elderly multigravida, unspecified trimester: Secondary | ICD-10-CM | POA: Insufficient documentation

## 2020-03-22 ENCOUNTER — Encounter: Payer: Self-pay | Admitting: Family Medicine

## 2020-04-13 ENCOUNTER — Encounter (HOSPITAL_COMMUNITY): Payer: Self-pay | Admitting: Obstetrics & Gynecology

## 2020-04-13 ENCOUNTER — Other Ambulatory Visit: Payer: Self-pay

## 2020-04-13 ENCOUNTER — Inpatient Hospital Stay (HOSPITAL_COMMUNITY)
Admission: AD | Admit: 2020-04-13 | Discharge: 2020-04-15 | DRG: 806 | Disposition: A | Discharge status: Home / Self Care | Payer: Medicaid Other | Attending: Obstetrics & Gynecology | Admitting: Obstetrics & Gynecology

## 2020-04-13 DIAGNOSIS — O9852 Other viral diseases complicating childbirth: Secondary | ICD-10-CM | POA: Diagnosis present

## 2020-04-13 DIAGNOSIS — Z3A37 37 weeks gestation of pregnancy: Secondary | ICD-10-CM

## 2020-04-13 DIAGNOSIS — O24420 Gestational diabetes mellitus in childbirth, diet controlled: Principal | ICD-10-CM | POA: Diagnosis present

## 2020-04-13 DIAGNOSIS — R87610 Atypical squamous cells of undetermined significance on cytologic smear of cervix (ASC-US): Secondary | ICD-10-CM | POA: Diagnosis not present

## 2020-04-13 DIAGNOSIS — Z20822 Contact with and (suspected) exposure to covid-19: Secondary | ICD-10-CM | POA: Diagnosis present

## 2020-04-13 DIAGNOSIS — O26893 Other specified pregnancy related conditions, third trimester: Secondary | ICD-10-CM | POA: Diagnosis present

## 2020-04-13 DIAGNOSIS — O24410 Gestational diabetes mellitus in pregnancy, diet controlled: Secondary | ICD-10-CM | POA: Diagnosis present

## 2020-04-13 DIAGNOSIS — O24430 Gestational diabetes mellitus in the puerperium, diet controlled: Secondary | ICD-10-CM | POA: Diagnosis not present

## 2020-04-13 DIAGNOSIS — O0990 Supervision of high risk pregnancy, unspecified, unspecified trimester: Secondary | ICD-10-CM

## 2020-04-13 DIAGNOSIS — O4202 Full-term premature rupture of membranes, onset of labor within 24 hours of rupture: Secondary | ICD-10-CM | POA: Diagnosis not present

## 2020-04-13 DIAGNOSIS — O09529 Supervision of elderly multigravida, unspecified trimester: Secondary | ICD-10-CM

## 2020-04-13 DIAGNOSIS — R87810 Cervical high risk human papillomavirus (HPV) DNA test positive: Secondary | ICD-10-CM | POA: Diagnosis present

## 2020-04-13 LAB — SARS CORONAVIRUS 2 BY RT PCR (HOSPITAL ORDER, PERFORMED IN ~~LOC~~ HOSPITAL LAB): SARS Coronavirus 2: NEGATIVE

## 2020-04-13 LAB — CBC
HCT: 40 % (ref 36.0–46.0)
Hemoglobin: 13.6 g/dL (ref 12.0–15.0)
MCH: 32.2 pg (ref 26.0–34.0)
MCHC: 34 g/dL (ref 30.0–36.0)
MCV: 94.6 fL (ref 80.0–100.0)
Platelets: 200 10*3/uL (ref 150–400)
RBC: 4.23 MIL/uL (ref 3.87–5.11)
RDW: 14.5 % (ref 11.5–15.5)
WBC: 6.8 10*3/uL (ref 4.0–10.5)
nRBC: 0 % (ref 0.0–0.2)

## 2020-04-13 LAB — TYPE AND SCREEN
ABO/RH(D): A POS
Antibody Screen: NEGATIVE

## 2020-04-13 LAB — GLUCOSE, CAPILLARY: Glucose-Capillary: 104 mg/dL — ABNORMAL HIGH (ref 70–99)

## 2020-04-13 MED ORDER — LACTATED RINGERS IV SOLN: INTRAVENOUS | Status: DC

## 2020-04-13 MED ORDER — ONDANSETRON HCL 4 MG PO TABS: 4.0000 mg | ORAL_TABLET | ORAL | Status: DC | PRN

## 2020-04-13 MED ORDER — MEASLES, MUMPS & RUBELLA VAC IJ SOLR: 0.5000 mL | Freq: Once | INTRAMUSCULAR | Status: DC

## 2020-04-13 MED ORDER — IBUPROFEN 600 MG PO TABS
600.0000 mg | ORAL_TABLET | Freq: Four times a day (QID) | ORAL | Status: DC
  Administered 2020-04-13 – 2020-04-15 (×8): 600 mg via ORAL
  Filled 2020-04-13 (×8): qty 1

## 2020-04-13 MED ORDER — COCONUT OIL OIL: 1.0000 "application " | TOPICAL_OIL | Status: DC | PRN

## 2020-04-13 MED ORDER — OXYCODONE-ACETAMINOPHEN 5-325 MG PO TABS: 1.0000 | ORAL_TABLET | ORAL | Status: DC | PRN

## 2020-04-13 MED ORDER — PRENATAL MULTIVITAMIN CH
1.0000 | ORAL_TABLET | Freq: Every day | ORAL | Status: DC
  Administered 2020-04-14 – 2020-04-15 (×2): 1 via ORAL
  Filled 2020-04-13 (×2): qty 1

## 2020-04-13 MED ORDER — ONDANSETRON HCL 4 MG/2ML IJ SOLN: 4.0000 mg | INTRAMUSCULAR | Status: DC | PRN

## 2020-04-13 MED ORDER — WITCH HAZEL-GLYCERIN EX PADS: 1.0000 "application " | MEDICATED_PAD | CUTANEOUS | Status: DC | PRN

## 2020-04-13 MED ORDER — ACETAMINOPHEN 325 MG PO TABS
650.0000 mg | ORAL_TABLET | ORAL | Status: DC | PRN
  Administered 2020-04-13: 650 mg via ORAL
  Filled 2020-04-13: qty 2

## 2020-04-13 MED ORDER — SODIUM CHLORIDE 0.9% FLUSH: 3.0000 mL | INTRAVENOUS | Status: DC | PRN

## 2020-04-13 MED ORDER — TETANUS-DIPHTH-ACELL PERTUSSIS 5-2.5-18.5 LF-MCG/0.5 IM SUSP: 0.5000 mL | Freq: Once | INTRAMUSCULAR | Status: DC

## 2020-04-13 MED ORDER — SOD CITRATE-CITRIC ACID 500-334 MG/5ML PO SOLN: 30.0000 mL | ORAL | Status: DC | PRN

## 2020-04-13 MED ORDER — SODIUM CHLORIDE 0.9 % IV SOLN
2.0000 g | Freq: Four times a day (QID) | INTRAVENOUS | Status: DC
  Administered 2020-04-13: 2 g via INTRAVENOUS
  Filled 2020-04-13: qty 2000

## 2020-04-13 MED ORDER — DIPHENHYDRAMINE HCL 25 MG PO CAPS: 25.0000 mg | ORAL_CAPSULE | Freq: Four times a day (QID) | ORAL | Status: DC | PRN

## 2020-04-13 MED ORDER — OXYCODONE-ACETAMINOPHEN 5-325 MG PO TABS: 2.0000 | ORAL_TABLET | ORAL | Status: DC | PRN

## 2020-04-13 MED ORDER — ONDANSETRON HCL 4 MG/2ML IJ SOLN: 4.0000 mg | Freq: Four times a day (QID) | INTRAMUSCULAR | Status: DC | PRN

## 2020-04-13 MED ORDER — SENNOSIDES-DOCUSATE SODIUM 8.6-50 MG PO TABS
2.0000 | ORAL_TABLET | ORAL | Status: DC
  Administered 2020-04-13 – 2020-04-14 (×2): 2 via ORAL
  Filled 2020-04-13 (×2): qty 2

## 2020-04-13 MED ORDER — FENTANYL CITRATE (PF) 100 MCG/2ML IJ SOLN
100.0000 ug | Freq: Once | INTRAMUSCULAR | Status: DC
  Filled 2020-04-13: qty 2

## 2020-04-13 MED ORDER — LACTATED RINGERS IV SOLN: 500.0000 mL | INTRAVENOUS | Status: DC | PRN

## 2020-04-13 MED ORDER — BENZOCAINE-MENTHOL 20-0.5 % EX AERO: 1.0000 "application " | INHALATION_SPRAY | CUTANEOUS | Status: DC | PRN

## 2020-04-13 MED ORDER — OXYTOCIN BOLUS FROM INFUSION
333.0000 mL | Freq: Once | INTRAVENOUS | Status: AC
  Administered 2020-04-13: 333 mL via INTRAVENOUS

## 2020-04-13 MED ORDER — LIDOCAINE HCL (PF) 1 % IJ SOLN: 30.0000 mL | INTRAMUSCULAR | Status: DC | PRN

## 2020-04-13 MED ORDER — OXYTOCIN-SODIUM CHLORIDE 30-0.9 UT/500ML-% IV SOLN
2.5000 [IU]/h | INTRAVENOUS | Status: DC
  Filled 2020-04-13: qty 500

## 2020-04-13 MED ORDER — SODIUM CHLORIDE 0.9 % IV SOLN: 250.0000 mL | INTRAVENOUS | Status: DC | PRN

## 2020-04-13 MED ORDER — DIBUCAINE (PERIANAL) 1 % EX OINT: 1.0000 "application " | TOPICAL_OINTMENT | CUTANEOUS | Status: DC | PRN

## 2020-04-13 MED ORDER — SIMETHICONE 80 MG PO CHEW: 80.0000 mg | CHEWABLE_TABLET | ORAL | Status: DC | PRN

## 2020-04-13 MED ORDER — FLEET ENEMA 7-19 GM/118ML RE ENEM: 1.0000 | ENEMA | RECTAL | Status: DC | PRN

## 2020-04-13 MED ORDER — SODIUM CHLORIDE 0.9% FLUSH
3.0000 mL | Freq: Two times a day (BID) | INTRAVENOUS | Status: DC
  Administered 2020-04-14: 3 mL via INTRAVENOUS

## 2020-04-13 MED ORDER — ACETAMINOPHEN 325 MG PO TABS: 650.0000 mg | ORAL_TABLET | ORAL | Status: DC | PRN

## 2020-04-13 NOTE — MAU Note (Signed)
Presents with c/o ctxs 7-10 minutes apart since 0600 this morning. Denies VB or LOF.  Endorses +FM.

## 2020-04-13 NOTE — H&P (Signed)
OBSTETRIC ADMISSION HISTORY AND PHYSICAL  Kathleen Mcgee is a 34 y.o. female [phone] with IUP at [redacted]w[redacted]d by LMP presenting for SOL. She reports +FMs, No LOF, no VB, no blurry vision, headaches or peripheral edema, and RUQ pain.  She plans on breast feeding. She is undecided for birth control.  She received her prenatal care at [facility] [place]   Dating: By LMP --->  Estimated Date of Delivery: 04/28/20  Sono:    @[redacted]w[redacted]d , CWD, normal anatomy, Cephalic presentation, 3019g, EFW   Prenatal History/Complications:  --A1GDM --AMA (34 yo)  --History of shoulder dystocia: 2 minutes 30 seconds requiring 5 different maneuvers   Past Medical History: Past Medical History:  Diagnosis Date  . Gestational diabetes   . Medical history non-contributory     Past Surgical History: Past Surgical History:  Procedure Laterality Date  . NO PAST SURGERIES      Obstetrical History: OB History    Gravida  4   Para  3   Term  3   Preterm      AB      Living  3     SAB      TAB      Ectopic      Multiple  0   Live Births  3           Social History Social History   Socioeconomic History  . Marital status: Married    Spouse name: Not on file  . Number of children: Not on file  . Years of education: Not on file  . Highest education level: Not on file  Occupational History  . Not on file  Tobacco Use  . Smoking status: Never Smoker  . Smokeless tobacco: Never Used  Vaping Use  . Vaping Use: Never used  Substance and Sexual Activity  . Alcohol use: No  . Drug use: No  . Sexual activity: Not Currently    Birth control/protection: None  Other Topics Concern  . Not on file  Social History Narrative  . Not on file   Social Determinants of Health   Financial Resource Strain:   . Difficulty of Paying Living Expenses: Not on file  Food Insecurity: No Food Insecurity  . Worried About (31 in the Last Year: Never true  . Ran Out of Food in the  Last Year: Never true  Transportation Needs: No Transportation Needs  . Lack of Transportation (Medical): No  . Lack of Transportation (Non-Medical): No  Physical Activity:   . Days of Exercise per Week: Not on file  . Minutes of Exercise per Session: Not on file  Stress:   . Feeling of Stress : Not on file  Social Connections:   . Frequency of Communication with Friends and Family: Not on file  . Frequency of Social Gatherings with Friends and Family: Not on file  . Attends Religious Services: Not on file  . Active Member of Clubs or Organizations: Not on file  . Attends Programme researcher, broadcasting/film/video Meetings: Not on file  . Marital Status: Not on file    Family History: History reviewed. No pertinent family history.  Allergies: No Known Allergies  Medications Prior to Admission  Medication Sig Dispense Refill Last Dose  . Prenatal Vit-Fe Fumarate-FA (PRENATAL VITAMIN PO) Take 1 tablet by mouth daily.    04/12/2020 at Unknown time  . aspirin EC 81 MG tablet Take 1 tablet (81 mg total) by mouth daily. 90 tablet 3   .  Lancets (ACCU-CHEK SOFT TOUCH) lancets Use as instructed 100 each 12   . nitrofurantoin, macrocrystal-monohydrate, (MACROBID) 100 MG capsule Take 1 capsule (100 mg total) by mouth 2 (two) times daily. (Patient not taking: Reported on 03/18/2020) 14 capsule 1      Review of Systems   All systems reviewed and negative except as stated in HPI  Blood pressure 111/80, pulse 90, temperature 98 F (36.7 C), temperature source Oral, resp. rate 18, height 5\' 1"  (1.549 m), weight 75.4 kg, last menstrual period 07/23/2019, SpO2 100 %, unknown if currently breastfeeding. General appearance: alert, cooperative and mild distress Lungs: No increased WOB Abdomen: soft, non-tender Extremities: Homans sign is negative, no sign of DVT Presentation: cephalic Fetal monitoringBaseline: 140 bpm, Moderate var, + accel, - decel  Uterine activity every 2-3 minutes  Dilation: 6 Effacement (%):  80 Station: -1 Exam by:: [email or web address] RN   Prenatal labs: ABO, Rh: --/--/A POS (09/14 1150) Antibody: NEG (09/14 1150) Rubella: Immune (05/24 0826) RPR: Non Reactive (06/30 1159)  HBsAg: Negative (05/24 0826)  HIV: Non Reactive (06/30 1159)  GBS:  Unknown  Genetic screening Declined  Anatomy 05-04-1986 Normal   Prenatal Transfer Tool  Maternal Diabetes: Yes:  Diabetes Type:  Diet controlled Genetic Screening: Declined Maternal Ultrasounds/Referrals: Normal Fetal Ultrasounds or other Referrals:  None Maternal Substance Abuse:  No Significant Maternal Medications:  None Significant Maternal Lab Results: None  Results for orders placed or performed during the hospital encounter of 04/13/20 (from the past 24 hour(s))  Type and screen [facility]   Collection Time: 04/13/20 11:50 AM  Result Value Ref Range   ABO/RH(D) A POS    Antibody Screen NEG    Sample Expiration      04/16/2020,2359 Performed at [facility], [address] [place], [place]   CBC   Collection Time: 04/13/20 12:27 PM  Result Value Ref Range   WBC 6.8 4.0 - 10.5 K/uL   RBC 4.23 3.87 - 5.11 MIL/uL   Hemoglobin 13.6 12.0 - 15.0 g/dL   HCT 04/15/20 36 - 46 %   MCV 94.6 80.0 - 100.0 fL   MCH 32.2 26.0 - 34.0 pg   MCHC 34.0 30.0 - 36.0 g/dL   RDW 79.8 92.1 - 19.4 %   Platelets 200 150 - 400 K/uL   nRBC 0.0 0.0 - 0.2 %  Glucose, capillary   Collection Time: 04/13/20  1:41 PM  Result Value Ref Range   Glucose-Capillary 104 (H) 70 - 99 mg/dL    Patient Active Problem List   Diagnosis Date Noted  . Normal labor 04/13/2020  . ASCUS with positive high risk HPV cervical 02/05/2020  . Supervision of high risk pregnancy, antepartum 01/28/2020  . History of shoulder dystocia in prior pregnancy 01/28/2020  . AMA (advanced maternal age) multigravida 35+   . Diet controlled gestational diabetes mellitus (GDM) in third trimester 08/30/2017    Assessment/Plan:  Kathleen Mcgee  is a 34 y.o. G4P3003 at [redacted]w[redacted]d here for SOL. Pregnancy complicated by A1GDM and previous shoulder dystocia.   #Labor: Progressing well, will allow expectant management.  #Pain: Non-medicated  #FWB: Cat 1 strip  #ID: GBS unknown, will proceed with Amp  #MOF: Breast  #MOC: Undecided, aware of options  #Circ: N/A  #A1GDM: Stable.  CBG 104 on admit. Q2 CBGs while in active labor.   #History of shoulder dystocia:  2.5 minutes requiring several maneuvers. Current EFW 98% at 34 weeks.  --Prepare for possible  dystocia    Allayne Stack, DO  04/13/2020, 2:13 PM

## 2020-04-13 NOTE — Discharge Summary (Signed)
Postpartum Discharge Summary    Patient Name: Kathleen Mcgee DOB: 02-26-1986 MRN: [id number]  Date of admission: 04/13/2020 Delivery date:04/13/2020  Delivering provider: Laury Deep  Date of discharge: 04/15/2020  Admitting diagnosis: Normal labor [O80, Z37.9] Intrauterine pregnancy: [redacted]w[redacted]d    Secondary diagnosis:  Active Problems:   Diet controlled gestational diabetes mellitus (GDM) in third trimester   AMA (advanced maternal age) multigravida 35+   ASCUS with positive high risk HPV cervical   Vaginal delivery  Additional problems: As noted above   Discharge diagnosis: Term Pregnancy Delivered                                              Post partum procedures: none Augmentation: N/A Complications: None  Hospital course: Onset of Labor With Vaginal Delivery      34y.o. yo [id number] admitted in Active Labor on 04/13/2020. Patient had an uncomplicated labor course as follows:  Membrane Rupture Time/Date: 3:00 PM ,04/13/2020   Delivery Method:Vaginal, Spontaneous  Episiotomy: None  Lacerations:  None  Patient had an uncomplicated postpartum course. She is ambulating, tolerating a regular diet, passing flatus, and urinating well. Patient is discharged home in stable condition on 04/15/20.  Newborn Data: Birth date:04/13/2020  Birth time:3:00 PM  Gender:Female  Living status:Living  Apgars:9 ,9  Weight:3530 g   Magnesium Sulfate received: No BMZ received: No Rhophylac:N/A MMR:No T-DaP:Given prenatally Flu: No Transfusion:No  Physical exam  Vitals:   04/14/20 0745 04/14/20 1430 04/14/20 2113 04/15/20 0537  BP: 107/73 108/73 115/76 104/89  Pulse: 72 61 65 64  Resp: _0 Temp: 97.6 F (36.4 C) 97.8 F (36.6 C) 97.7 F (36.5 C) 98 F (36.7 C)  TempSrc: Axillary Oral Oral Oral  SpO2: 100%   98%  Weight:      Height:       General: alert, cooperative and no distress Lochia: appropriate Uterine Fundus: firm Incision: N/A DVT  Evaluation: No evidence of DVT seen on physical exam. No cords or calf tenderness. No significant calf/ankle edema. Labs: Lab Results  Component Value Date   WBC 6.8 04/13/2020   HGB 13.6 04/13/2020   HCT 40.0 04/13/2020   MCV 94.6 04/13/2020   PLT 200 04/13/2020   CMP Latest Ref Rng & Units 01/28/2020  Glucose 65 - 99 mg/dL 131(H)  BUN 6 - 20 mg/dL 7  Creatinine 0.57 - 1.00 mg/dL 0.48(L)  Sodium 134 - 144 mmol/L 135  Potassium 3.5 - 5.2 mmol/L 4.0  Chloride 96 - 106 mmol/L 102  CO2 20 - 29 mmol/L 18(L)  Calcium 8.7 - 10.2 mg/dL 9.1  Total Protein 6.0 - 8.5 g/dL 6.0  Total Bilirubin 0.0 - 1.2 mg/dL <0.2  Alkaline Phos 48 - 121 IU/L 96  AST 0 - 40 IU/L 9  ALT 0 - 32 IU/L 11   Edinburgh Score: Edinburgh Postnatal Depression Scale Screening Tool 04/14/2020  I have been able to laugh and see the funny side of things. 0  I have looked forward with enjoyment to things. 0  I have blamed myself unnecessarily when things went wrong. 0  I have been anxious or worried for no good reason. 0  I have felt scared or panicky for no good reason. 0  Things have been getting on top of me. 1  I have been so unhappy  that I have had difficulty sleeping. 0  I have felt sad or miserable. 0  I have been so unhappy that I have been crying. 0  The thought of harming myself has occurred to me. 0  Edinburgh Postnatal Depression Scale Total 1     After visit meds:  Allergies as of 04/15/2020   No Known Allergies     Medication List    STOP taking these medications   accu-chek soft touch lancets   aspirin EC 81 MG tablet   nitrofurantoin (macrocrystal-monohydrate) 100 MG capsule Commonly known as: MACROBID     TAKE these medications   acetaminophen 325 MG tablet Commonly known as: Tylenol Take 2 tablets (650 mg total) by mouth every 6 (six) hours as needed for mild pain (for pain scale < 4).   coconut oil Oil Apply 1 application topically as needed (nipple pain).   ibuprofen 600 MG  tablet Commonly known as: ADVIL Take 1 tablet (600 mg total) by mouth every 8 (eight) hours as needed.   PRENATAL VITAMIN PO Take 1 tablet by mouth daily.        Discharge home in stable condition Infant Feeding: Breast Infant Disposition:home with mother Discharge instruction: per After Visit Summary and Postpartum booklet. Activity: Advance as tolerated. Pelvic rest for 6 weeks.  Diet: routine diet Future Appointments: Future Appointments  Date Time Provider [place]  05/19/2020  9:15 AM Clarisa Fling, NP [facility]  05/19/2020 10:00 AM [facility]   Follow up Visit: Please schedule this patient for a In person postpartum visit in 4 weeks with the following provider: Any provider. Additional Postpartum F/U:2 hour GTT  High risk pregnancy complicated by: GDM Delivery mode:  Vaginal, Spontaneous  Anticipated Birth Control:  Unsure--will use condoms until postpartum appt   04/15/2020 Randa Ngo, MD

## 2020-04-14 LAB — RPR: RPR Ser Ql: NONREACTIVE

## 2020-04-14 NOTE — Progress Notes (Signed)
Post Partum Day 1 Subjective: Pt has no complaints this morning. Ambulating, voiding, tolerating diet and good oral pain control, Breast feeding  Objective: Blood pressure 107/73, pulse 72, temperature 97.6 F (36.4 C), temperature source Axillary, resp. rate 18, height 5\' 1"  (1.549 m), weight 75.4 kg, last menstrual period 07/23/2019, SpO2 100 %, unknown if currently breastfeeding.  Physical Exam:  General: alert Lochia: appropriate Uterine Fundus: firm Incision: healing well DVT Evaluation: No evidence of DVT seen on physical exam.  Recent Labs    04/13/20 1227  HGB 13.6  HCT 40.0    Assessment/Plan: Plan for discharge tomorrow. CBG 104.    LOS: 1 day   04/15/20 04/14/2020, 10:23 AM

## 2020-04-14 NOTE — Lactation Note (Signed)
This note was copied from a baby's chart. Lactation Consultation Note  Patient Name: Girl Anesha Hackert [id number] Date: 04/14/2020   Spoke with RN, Thomos Lemons who reports she will ask mom is she wants [facility] consult.   Maternal Data    Feeding Feeding Type: Breast Fed  LATCH Score                   Interventions    Lactation Tools Discussed/Used     Consult Status      Grayce Budden Michaelle Copas 04/14/2020, 5:30 PM

## 2020-04-14 NOTE — Lactation Note (Signed)
This note was copied from a baby's chart. Lactation Consultation Note  Patient Name: Kathleen Mcgee [id number] Date: 04/14/2020 Reason for consult: (P) Initial assessment Baby Kathleen Dalila now 78 hours old. [address] Tamela Gammon, [phone],  Mom recently breastfed her on arrival but infant still cuing.  Mom reports she does not have enough milk. Mom reports she's always hungry.  Inquired about hand expression.  Mom reported she never did it.  LC assisted with hand expression and spoon feeding and mom let infant suck on her finger during spoons because infant would get so fussy.  After approximately 10 ml via spoon Dalila started to calm some and mom put her back on the breast.  Mom has very tender but intact nipples. Mom latched Dalila in cradle hold and barely on tip of nipple on left breast.  Showed mom how to bring her up some to  breast level and that her cheeks and chin should touch the breast.  Mom reports more comfort.  Infant very sleepy at the breast.  Took just a few small sucks, no swallows noted and fell asleep.  Left STS with mom.   Gave mom manual pump.  Demo how to take apart and put it together and clean.  Also demo using it on moms left breast.  Gave mom 27 mm flange to use with manual pump.  Praised breastfeeding.  Urged mom to breastfeed first and give either expressed mothers milk before formula.  Mom thanked [facility] for teaching her. Urged her to call lactation as needed.  Maternal Data Formula Feeding for Exclusion: (P) Yes Reason for exclusion: (P) Mother's choice to formula and breast feed on admission Has patient been taught Hand Expression?: (P) Yes Does the patient have breastfeeding experience prior to this delivery?: (P) Yes  Feeding Feeding Type: Breast Fed  [facility] Score                   Interventions    Lactation Tools Discussed/Used     Consult Status      Sasuke Yaffe Michaelle Copas 04/14/2020, 7:27 PM

## 2020-04-15 DIAGNOSIS — O24430 Gestational diabetes mellitus in the puerperium, diet controlled: Secondary | ICD-10-CM

## 2020-04-15 DIAGNOSIS — R87610 Atypical squamous cells of undetermined significance on cytologic smear of cervix (ASC-US): Secondary | ICD-10-CM

## 2020-04-15 MED ORDER — COCONUT OIL OIL: 1.0000 "application " | TOPICAL_OIL | 0 refills | Status: AC | PRN

## 2020-04-15 MED ORDER — ACETAMINOPHEN 325 MG PO TABS: 650.0000 mg | ORAL_TABLET | Freq: Four times a day (QID) | ORAL | Status: AC | PRN

## 2020-04-15 MED ORDER — IBUPROFEN 600 MG PO TABS: 600.0000 mg | ORAL_TABLET | Freq: Three times a day (TID) | ORAL | 0 refills | Status: AC | PRN

## 2020-04-15 NOTE — Lactation Note (Addendum)
This note was copied from a baby's chart. Lactation Consultation Note  Patient Name: Kathleen Mcgee IONGE'X Date: 04/15/2020 Reason for consult: Follow-up assessment Type of Endocrine Disorder?: Diabetes  Consult was done in Spanish  Follow up with 43 hours old infant with 6.52% weight loss. Infant is sleeping in mother's arms. Mother states infant breastfed for ~20 minutes total, both breasts at 8 am. Mother reports breastfeeding is going well and only supplemented with formula a few times yesterday. Per mother infant has been having good output. Mother received a manual breast pump to take home upon discharge to use PRN. Discussed engorgement signs and what to expect with milk coming in. Mother has experience breastfeeding and is aware of changes. Encouraged to get a deep latch when infant has mouth wide open to protect nipples and have more effective milk transfer. Mother verbalized understanding. Encouraged to call for assistance with latch before going home. Reviewed resources available and encouraged to contact lactation services as needed for any questions or concerns.    Maternal Data Does the patient have breastfeeding experience prior to this delivery?: Yes  Feeding Feeding Type: Breast Fed  Interventions Interventions: Breast feeding basics reviewed;Hand pump  Lactation Tools Discussed/Used WIC Program: Yes   Consult Status Consult Status: Complete Date: 04/15/20 Follow-up type: Call as needed    Kendle Erker A Higuera Ancidey 04/15/2020, 10:13 AM

## 2020-04-15 NOTE — Discharge Instructions (Signed)

## 2020-05-19 ENCOUNTER — Other Ambulatory Visit: Payer: Self-pay

## 2020-05-19 ENCOUNTER — Ambulatory Visit: Payer: Self-pay | Admitting: Women's Health

## 2020-05-19 ENCOUNTER — Other Ambulatory Visit: Payer: Self-pay | Admitting: General Practice

## 2020-05-19 ENCOUNTER — Encounter: Payer: Self-pay | Admitting: Obstetrics & Gynecology

## 2020-05-19 DIAGNOSIS — Z8632 Personal history of gestational diabetes: Secondary | ICD-10-CM

## 2020-09-20 IMAGING — US US MFM OB DETAIL+14 WK
1 series · 13 of 28 positions shown · non-contrast
Comparison: none

[Series 1: us mfm ob detail+14 wk · 113 acquisitions, 13 frames shown]
[im 5/113]
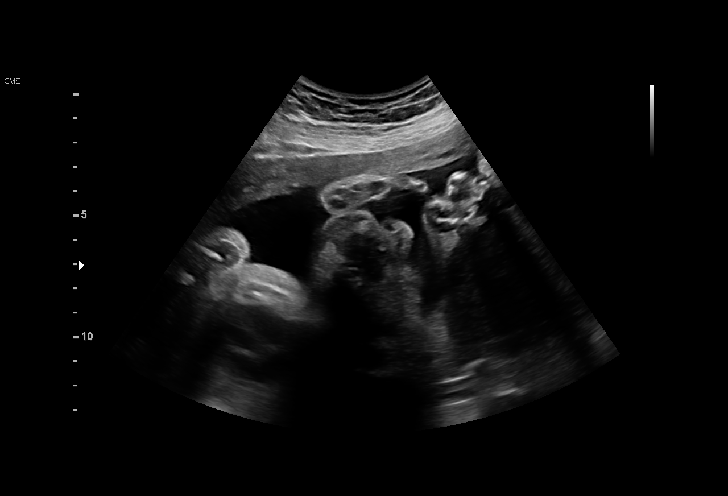
[im 13/113]
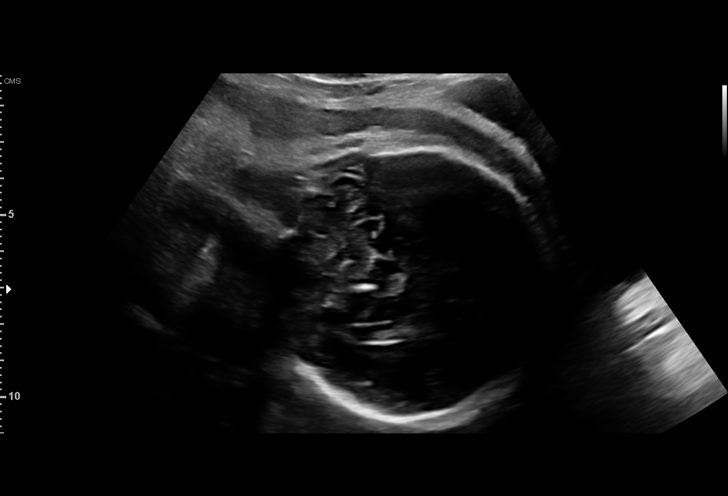
[im 21/113]
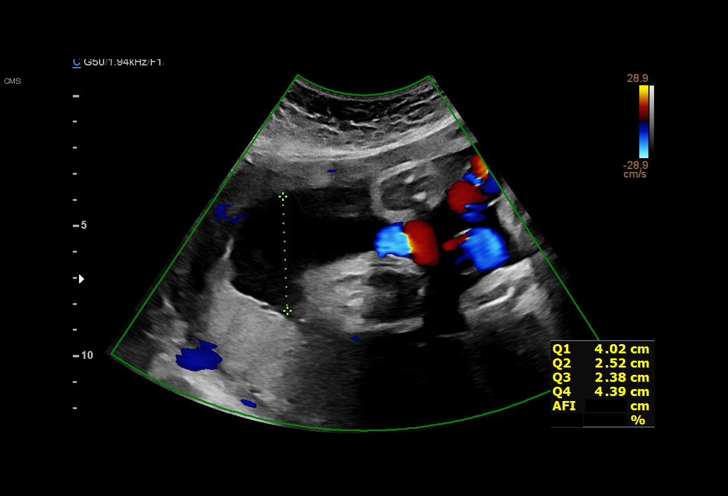
[im 30/113]
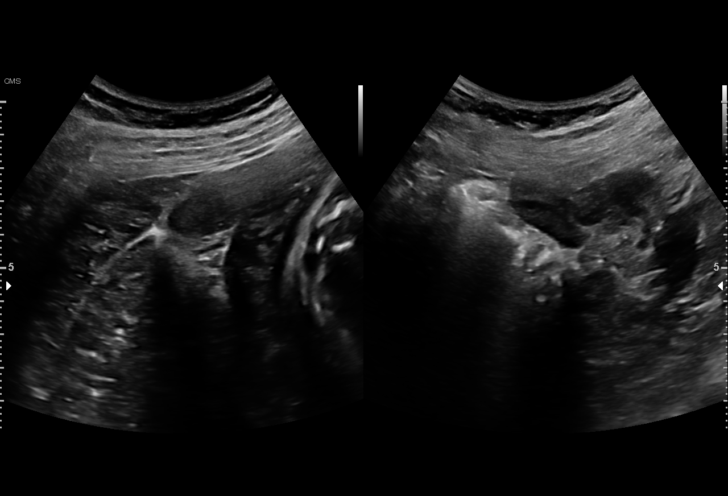
[im 38/113]
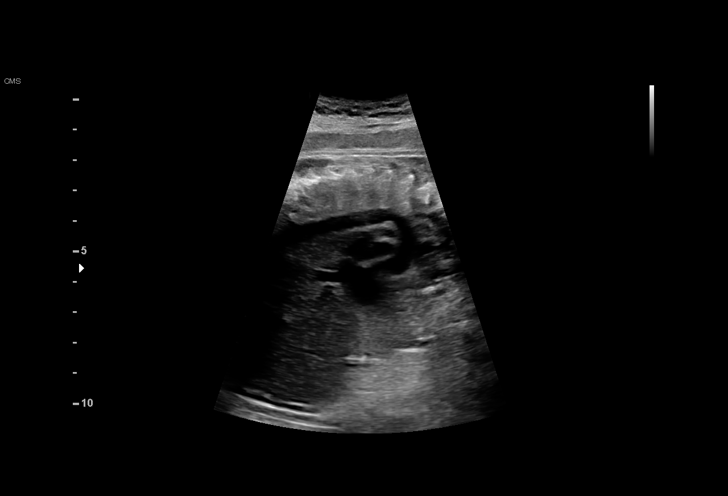
[im 46/113]
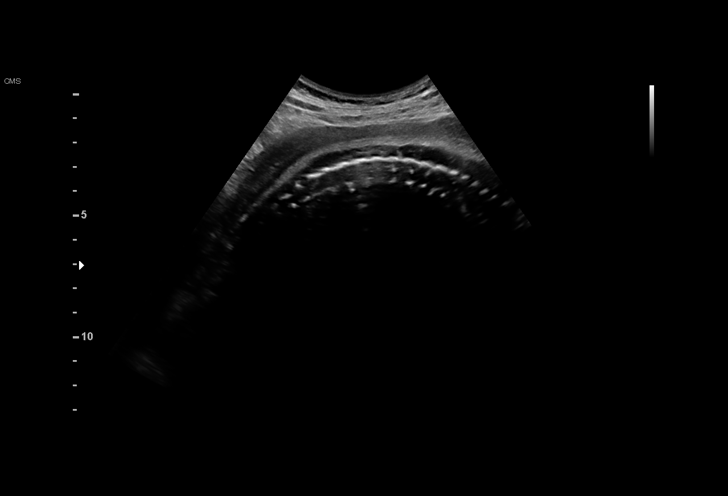
[im 59/113]
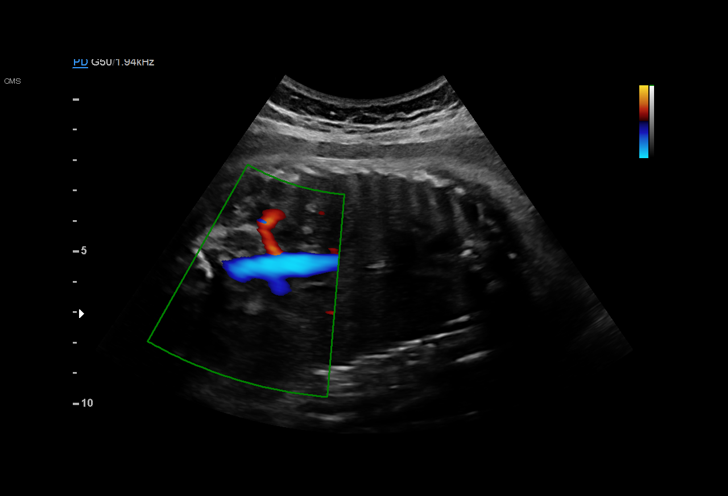
[im 67/113]
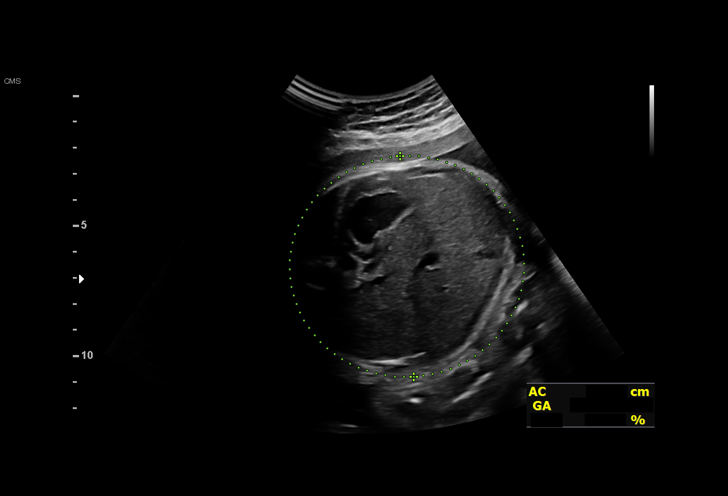
[im 75/113]
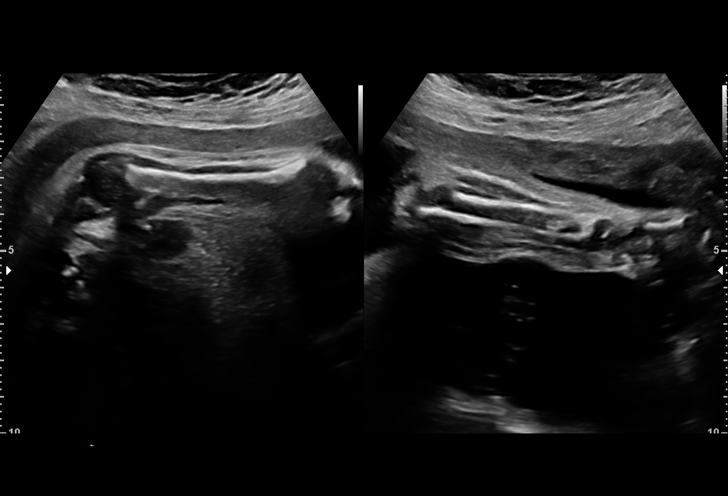
[im 83/113]
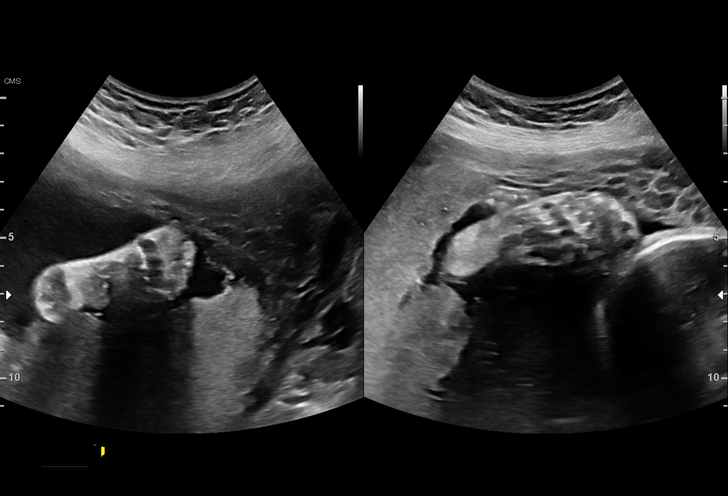
[im 92/113]
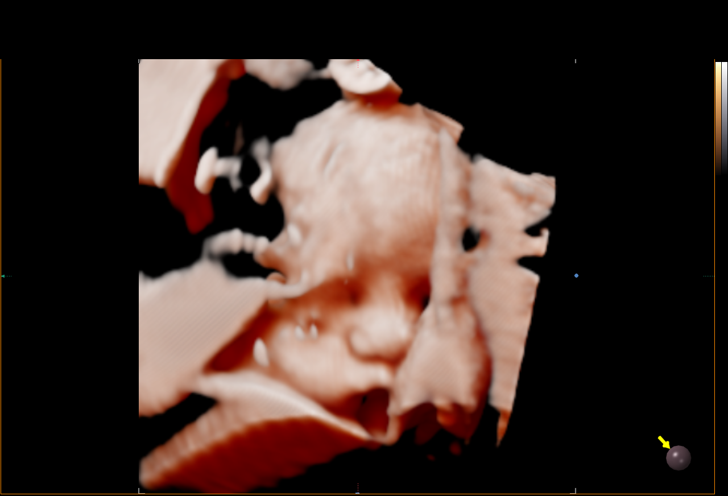
[im 100/113]
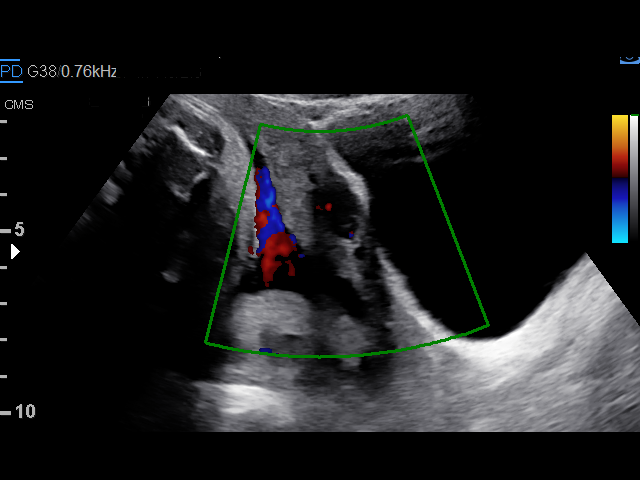
[im 108/113]
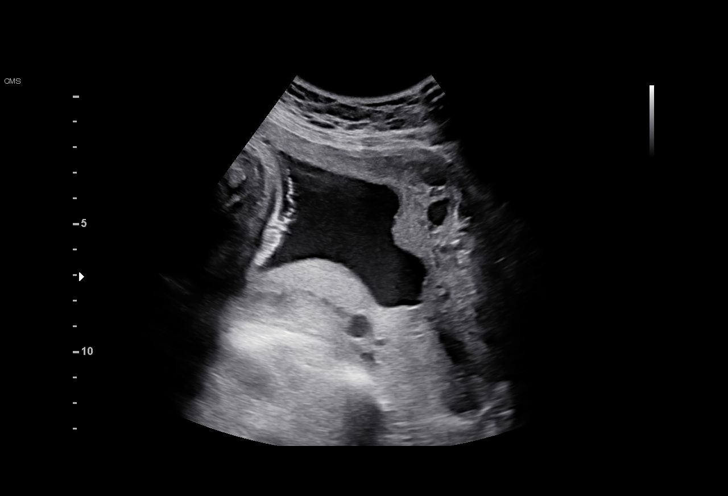

[13 of 28 positions shown; findings below may reference images not displayed]

JOHAQUIN

Indications

 Gestational diabetes in pregnancy, diet
 controlled
 Advanced maternal age multigravida 35+,
 third trimester
 Antenatal screening for malformations
 30 weeks gestation of pregnancy
Fetal Evaluation

 Num Of Fetuses:         1
 Fetal Heart Rate(bpm):  170
 Cardiac Activity:       Observed
 Presentation:           Cephalic
 Placenta:               Posterior
 P. Cord Insertion:      Visualized

 Amniotic Fluid
 AFI FV:      Within normal limits

 AFI Sum(cm)     %Tile       Largest Pocket(cm)
 13.3            40

 RUQ(cm)       RLQ(cm)       LUQ(cm)        LLQ(cm)
 4
Biometry
 BPD:      75.3  mm     G. Age:  30w 1d         40  %    CI:        77.99   %    70 - 86
                                                         FL/HC:      22.1   %    19.2 -
 HC:      269.8  mm     G. Age:  29w 3d          6  %    HC/AC:      0.97        0.99 -
 AC:      278.4  mm     G. Age:  31w 6d         89  %    FL/BPD:     79.0   %    71 - 87
 FL:       59.5  mm     G. Age:  31w 0d         60  %    FL/AC:      21.4   %    20 - 24
 HUM:        49  mm     G. Age:  28w 6d         24  %

 Est. FW:    0020  gm    3 lb 13 oz      75  %
OB History

 Gravidity:    4         Term:   3        Prem:   0        SAB:   0
 TOP:          0       Ectopic:  0        Living: 3
Gestational Age

 LMP:           30w 1d        Date:  07/23/19                 EDD:   04/28/20
 U/S Today:     30w 4d                                        EDD:   04/25/20
 Best:          30w 1d     Det. By:  LMP  (07/23/19)          EDD:   04/28/20
Anatomy

 Cranium:               Appears normal         Aortic Arch:            Appears normal
 Cavum:                 Appears normal         Ductal Arch:            Appears normal
 Ventricles:            Appears normal         Diaphragm:              Appears normal
 Choroid Plexus:        Appears normal         Stomach:                Appears normal, left
                                                                       sided
 Cerebellum:            Appears normal         Abdomen:                Appears normal
 Posterior Fossa:       Appears normal         Abdominal Wall:         Appears nml (cord
                                                                       insert, abd wall)
 Nuchal Fold:           Not applicable (>20    Cord Vessels:           Appears normal (3
                        wks GA)                                        vessel cord)
 Face:                  Appears normal         Kidneys:                Appear normal
                        (orbits and profile)
 Lips:                  Appears normal         Bladder:                Appears normal
 Thoracic:              Appears normal         Spine:                  Limited views
                                                                       appear normal
 Heart:                 Appears normal         Upper Extremities:      Visualized
                        (4CH, axis, and
                        situs)
 RVOT:                  Appears normal         Lower Extremities:      Appears normal
 LVOT:                  Appears normal

 Other:  Heels visualized. Nasal bone visualized.
Cervix Uterus Adnexa

 Cervix
 Not visualized (advanced GA >62wks)

 Uterus
 No abnormality visualized.

 Right Ovary
 Within normal limits.
 Left Ovary
 Within normal limits.

 Cul De Sac
 No free fluid seen.

 Adnexa
 No abnormality visualized.
Comments

 This patient was seen for an ultrasound exam due to recently
 diagnosed diet-controlled gestational diabetes. She reports a
 history of 3 prior full-term vaginal deliveries without any
 complications. She reports that her fingerstick values have
 been within normal limits.
 She was informed that the fetal growth and amniotic fluid
 level appears appropriate for her gestational age.
 The views of the fetal anatomy were limited today due to her
 advanced gestational age.
 The implications and management of diabetes in pregnancy
 was discussed in detail with the patient. She was advised that
 our goals for her fingerstick values are fasting values of 90-95
 or less and two-hour postprandials of 120 or less.  Should her
 fingerstick values be above these values, she may have to be
 started on insulin or metformin to help her achieve better
 glycemic control. The patient was advised that getting her
 fingerstick values as close to these goals as possible would
 provide her with the most optimal obstetrical outcome.
 A follow up exam was scheduled in 4 weeks. Weekly fetal
 testing should be started should she require insulin or
 Metformin treatment for gestational diabetes.
 All conversations were held with the patient today with the
 help of a Spanish interpreter.
